# Patient Record
Sex: Female | Born: 1960 | Race: White | Hispanic: No | Marital: Married | Smoking: Former smoker
Health system: Southern US, Community
[De-identification: ages and names within clinical notes are randomized; demographics above are authoritative.]

## PROBLEM LIST (undated history)

## (undated) DIAGNOSIS — J449 Chronic obstructive pulmonary disease, unspecified: Secondary | ICD-10-CM

## (undated) DIAGNOSIS — J189 Pneumonia, unspecified organism: Secondary | ICD-10-CM

## (undated) DIAGNOSIS — C539 Malignant neoplasm of cervix uteri, unspecified: Secondary | ICD-10-CM

## (undated) HISTORY — PX: NO PAST SURGERIES: SHX2092

---

## 2016-02-29 ENCOUNTER — Telehealth: Payer: Self-pay

## 2016-02-29 NOTE — Telephone Encounter (Signed)
lmov to schedule consult appt per referral.  02/29/16 [facility]

## 2016-03-02 ENCOUNTER — Inpatient Hospital Stay
Admission: EM | Admit: 2016-03-02 | Discharge: 2016-03-11 | DRG: 193 | Disposition: A | Discharge status: Home / Self Care | Payer: BC Managed Care – PPO | Attending: Internal Medicine | Admitting: Internal Medicine

## 2016-03-02 ENCOUNTER — Encounter: Payer: Self-pay | Admitting: Medical Oncology

## 2016-03-02 ENCOUNTER — Emergency Department: Payer: BC Managed Care – PPO

## 2016-03-02 DIAGNOSIS — J9602 Acute respiratory failure with hypercapnia: Secondary | ICD-10-CM | POA: Diagnosis not present

## 2016-03-02 DIAGNOSIS — J111 Influenza due to unidentified influenza virus with other respiratory manifestations: Secondary | ICD-10-CM | POA: Diagnosis not present

## 2016-03-02 DIAGNOSIS — E871 Hypo-osmolality and hyponatremia: Secondary | ICD-10-CM | POA: Diagnosis present

## 2016-03-02 DIAGNOSIS — J9600 Acute respiratory failure, unspecified whether with hypoxia or hypercapnia: Secondary | ICD-10-CM

## 2016-03-02 DIAGNOSIS — F17210 Nicotine dependence, cigarettes, uncomplicated: Secondary | ICD-10-CM | POA: Diagnosis present

## 2016-03-02 DIAGNOSIS — J9601 Acute respiratory failure with hypoxia: Secondary | ICD-10-CM | POA: Diagnosis present

## 2016-03-02 DIAGNOSIS — J101 Influenza due to other identified influenza virus with other respiratory manifestations: Secondary | ICD-10-CM | POA: Diagnosis present

## 2016-03-02 DIAGNOSIS — J441 Chronic obstructive pulmonary disease with (acute) exacerbation: Secondary | ICD-10-CM | POA: Diagnosis present

## 2016-03-02 DIAGNOSIS — J9690 Respiratory failure, unspecified, unspecified whether with hypoxia or hypercapnia: Secondary | ICD-10-CM | POA: Diagnosis present

## 2016-03-02 DIAGNOSIS — R531 Weakness: Secondary | ICD-10-CM

## 2016-03-02 DIAGNOSIS — T380X5A Adverse effect of glucocorticoids and synthetic analogues, initial encounter: Secondary | ICD-10-CM | POA: Diagnosis present

## 2016-03-02 DIAGNOSIS — Z8541 Personal history of malignant neoplasm of cervix uteri: Secondary | ICD-10-CM

## 2016-03-02 DIAGNOSIS — R262 Difficulty in walking, not elsewhere classified: Secondary | ICD-10-CM

## 2016-03-02 DIAGNOSIS — J432 Centrilobular emphysema: Secondary | ICD-10-CM | POA: Diagnosis not present

## 2016-03-02 DIAGNOSIS — R0603 Acute respiratory distress: Secondary | ICD-10-CM | POA: Diagnosis present

## 2016-03-02 HISTORY — DX: Pneumonia, unspecified organism: J18.9

## 2016-03-02 HISTORY — DX: Malignant neoplasm of cervix uteri, unspecified: C53.9

## 2016-03-02 HISTORY — DX: Chronic obstructive pulmonary disease, unspecified: J44.9

## 2016-03-02 LAB — BLOOD GAS, ARTERIAL
ACID-BASE EXCESS: 5 mmol/L — AB (ref 0.0–2.0)
BICARBONATE: 31 mmol/L — AB (ref 20.0–28.0)
Delivery systems: POSITIVE
EXPIRATORY PAP: 6
FIO2: 0.8
Inspiratory PAP: 10
Mechanical Rate: 8
O2 SAT: 97.8 %
PATIENT TEMPERATURE: 37
PO2 ART: 101 mmHg (ref 83.0–108.0)
pCO2 arterial: 50 mmHg — ABNORMAL HIGH (ref 32.0–48.0)
pH, Arterial: 7.4 (ref 7.350–7.450)

## 2016-03-02 LAB — BLOOD GAS, VENOUS
Acid-Base Excess: 4.8 mmol/L — ABNORMAL HIGH (ref 0.0–2.0)
Bicarbonate: 31 mmol/L — ABNORMAL HIGH (ref 20.0–28.0)
FIO2: 0.8
O2 Saturation: 83.4 %
PCO2 VEN: 50 mmHg (ref 44.0–60.0)
PH VEN: 7.4 (ref 7.250–7.430)
Patient temperature: 37
pO2, Ven: 48 mmHg — ABNORMAL HIGH (ref 32.0–45.0)

## 2016-03-02 LAB — CBC
HCT: 43.3 % (ref 35.0–47.0)
Hemoglobin: 14.9 g/dL (ref 12.0–16.0)
MCH: 29 pg (ref 26.0–34.0)
MCHC: 34.3 g/dL (ref 32.0–36.0)
MCV: 84.7 fL (ref 80.0–100.0)
PLATELETS: 189 10*3/uL (ref 150–440)
RBC: 5.12 MIL/uL (ref 3.80–5.20)
RDW: 14.2 % (ref 11.5–14.5)
WBC: 9.4 10*3/uL (ref 3.6–11.0)

## 2016-03-02 LAB — TROPONIN I: Troponin I: <0.03 ng/mL (ref <0.03)

## 2016-03-02 LAB — BASIC METABOLIC PANEL
ANION GAP: 10 (ref 5–15)
BUN: 18 mg/dL (ref 6–20)
CALCIUM: 8.5 mg/dL — AB (ref 8.9–10.3)
CHLORIDE: 94 mmol/L — AB (ref 101–111)
CO2: 28 mmol/L (ref 22–32)
Creatinine, Ser: 0.68 mg/dL (ref 0.44–1.00)
GFR calc Af Amer: >60 mL/min (ref >60)
GFR calc non Af Amer: >60 mL/min (ref >60)
Glucose, Bld: 134 mg/dL — ABNORMAL HIGH (ref 65–99)
Potassium: 4.4 mmol/L (ref 3.5–5.1)
SODIUM: 132 mmol/L — AB (ref 135–145)

## 2016-03-02 LAB — INFLUENZA PANEL BY PCR (TYPE A & B)
INFLAPCR: POSITIVE — AB
INFLBPCR: NEGATIVE

## 2016-03-02 LAB — LACTIC ACID, PLASMA: LACTIC ACID, VENOUS: 1.6 mmol/L (ref 0.5–1.9)

## 2016-03-02 MED ORDER — IPRATROPIUM-ALBUTEROL 0.5-2.5 (3) MG/3ML IN SOLN
3.0000 mL | Freq: Once | RESPIRATORY_TRACT | Status: AC
  Administered 2016-03-02: 3 mL via RESPIRATORY_TRACT
  Filled 2016-03-02: qty 3

## 2016-03-02 MED ORDER — IPRATROPIUM-ALBUTEROL 0.5-2.5 (3) MG/3ML IN SOLN: 3.0000 mL | RESPIRATORY_TRACT | Status: DC | PRN

## 2016-03-02 MED ORDER — IBUPROFEN 400 MG PO TABS: 600.0000 mg | ORAL_TABLET | Freq: Four times a day (QID) | ORAL | Status: DC | PRN

## 2016-03-02 MED ORDER — LORAZEPAM 2 MG/ML IJ SOLN
INTRAMUSCULAR | Status: AC
  Filled 2016-03-02: qty 1

## 2016-03-02 MED ORDER — OSELTAMIVIR PHOSPHATE 75 MG PO CAPS
75.0000 mg | ORAL_CAPSULE | Freq: Once | ORAL | Status: AC
  Administered 2016-03-02: 75 mg via ORAL
  Filled 2016-03-02: qty 1

## 2016-03-02 MED ORDER — ACETAMINOPHEN 325 MG PO TABS
650.0000 mg | ORAL_TABLET | ORAL | Status: DC | PRN
  Administered 2016-03-03: 650 mg via ORAL
  Filled 2016-03-02: qty 2

## 2016-03-02 MED ORDER — HEPARIN SODIUM (PORCINE) 5000 UNIT/ML IJ SOLN
5000.0000 [IU] | Freq: Three times a day (TID) | INTRAMUSCULAR | Status: DC
  Administered 2016-03-03: 5000 [IU] via SUBCUTANEOUS
  Filled 2016-03-02: qty 1

## 2016-03-02 MED ORDER — METHYLPREDNISOLONE SODIUM SUCC 40 MG IJ SOLR
40.0000 mg | Freq: Four times a day (QID) | INTRAMUSCULAR | Status: DC
  Administered 2016-03-02 – 2016-03-03 (×3): 40 mg via INTRAVENOUS
  Filled 2016-03-02 (×3): qty 1

## 2016-03-02 MED ORDER — LORAZEPAM 2 MG/ML IJ SOLN
0.5000 mg | Freq: Once | INTRAMUSCULAR | Status: AC
  Administered 2016-03-02: 0.5 mg via INTRAVENOUS

## 2016-03-02 MED ORDER — OSELTAMIVIR PHOSPHATE 30 MG PO CAPS
30.0000 mg | ORAL_CAPSULE | Freq: Two times a day (BID) | ORAL | Status: AC
  Administered 2016-03-03 – 2016-03-07 (×9): 30 mg via ORAL
  Filled 2016-03-02 (×11): qty 1

## 2016-03-02 MED ORDER — OSELTAMIVIR PHOSPHATE 75 MG PO CAPS: 75.0000 mg | ORAL_CAPSULE | Freq: Two times a day (BID) | ORAL | Status: DC

## 2016-03-02 MED ORDER — NICOTINE 7 MG/24HR TD PT24
7.0000 mg | MEDICATED_PATCH | Freq: Every day | TRANSDERMAL | Status: DC
  Filled 2016-03-02 (×5): qty 1

## 2016-03-02 NOTE — ED Triage Notes (Signed)
Pt from home via ems with reports that she was diagnosed with pneumonia Friday, since then she has been taking levaquin and prednisone she began to feel better then yesterday began having worsening sob. Pt reports back pain and non productive cough. When ems arrived pts O2 sats were 72% on RA. Pt was placed on 4L [place], given 1 albuterol, 2 duonebs and 125mg  solumedrol pta.

## 2016-03-02 NOTE — ED Notes (Signed)
Pt anxious d/t bipap. MD made aware.

## 2016-03-02 NOTE — H&P (Signed)
PULMONARY / CRITICAL CARE MEDICINE   Name: Cristina Hale MRN: [id number] DOB: 1960-10-09    ADMISSION DATE:  03/02/2016 CONSULTATION DATE: 03/03/15  REFERRING MD:  Dr. Rudene Re  CHIEF COMPLAINT:  Acute Respiratory distress  HISTORY OF PRESENT ILLNESS:   Cristina Hale is 56 yo female with PMH significant for tobacco abuse and COPD.  Patient states that she was diagnosed with right sided pneumonia 5 days ago at urgent care and was placed on levaquin.  Patient's condition has progressively worsened since then with progressive shortness of breath and poor oral intake.  Patient denies any fever, nausea,vomitting or chest pain at this time.  Patient was experiencing increased shortness of breath therefore was placed on Bipap. She was noted to be positive for flu. Therefore PCCM team was called to admit the patient.  PAST MEDICAL HISTORY :  She  has a past medical history of Cervical cancer ([place]).  PAST SURGICAL HISTORY: She  has no past surgical history on file.  No Known Allergies  No current facility-administered medications on file prior to encounter.    No current outpatient prescriptions on file prior to encounter.    FAMILY HISTORY:  Her has no family status information on file.    SOCIAL HISTORY: She  reports that she has been smoking.  She has been smoking about 0.50 packs per day. She has never used smokeless tobacco. She reports that she does not drink alcohol or use drugs.  REVIEW OF SYSTEMS:   Review of Systems  Constitutional: Positive for malaise/fatigue. Negative for chills and weight loss.  HENT: Negative for congestion, nosebleeds and sinus pain.   Eyes: Negative for photophobia, pain and discharge.  Respiratory: Positive for shortness of breath and wheezing. Negative for cough, hemoptysis and stridor.   Cardiovascular: Negative for orthopnea and claudication.  Gastrointestinal: Negative for abdominal pain, blood in stool, constipation and diarrhea.   Genitourinary: Negative for frequency and hematuria.  Musculoskeletal: Negative for back pain and joint pain.  Neurological: Positive for weakness. Negative for tremors and sensory change.  Endo/Heme/Allergies: Negative for environmental allergies and polydipsia.  Psychiatric/Behavioral: The patient is not nervous/anxious and does not have insomnia.      SUBJECTIVE:  Patient states that she has been having increased shortness of breath.  VITAL SIGNS: BP (!) 135/91   Pulse (!) 125   Temp 97.9 F (36.6 C) (Oral)   Resp (!) 24   Ht 5\' 2"  (1.575 m)   Wt 44 kg (97 lb)   SpO2 98%   BMI 17.74 kg/m   HEMODYNAMICS:    VENTILATOR SETTINGS: FiO2 (%):  [80 %] 80 %  INTAKE / OUTPUT: No intake/output data recorded.  PHYSICAL EXAMINATION: General:  Middle aged female, on Bipap , in moderate distress Neuro:  Neuro intact HEENT:  AT,[place], no jvd, PERRLA Cardiovascular: Tachycardic, regular, no MRG noted Lungs:  Faint expiratory wheezes, no crackles, rhonchi noted Abdomen:  Soft, non tender, + BS Musculoskeletal:  No inflammation/deformity, ROM intact Skin: warm, intact and dry.  LABS:  BMET  Recent Labs Lab 03/02/16 1727  NA 132*  K 4.4  CL 94*  CO2 28  BUN 18  CREATININE 0.68  GLUCOSE 134*    Electrolytes  Recent Labs Lab 03/02/16 1727  CALCIUM 8.5*    CBC  Recent Labs Lab 03/02/16 1727  WBC 9.4  HGB 14.9  HCT 43.3  PLT 189    Coag's No results for input(s): APTT, INR in the last 168 hours.  Sepsis  Markers  Recent Labs Lab 03/02/16 1727  LATICACIDVEN 1.6    ABG No results for input(s): PHART, PCO2ART, PO2ART in the last 168 hours.  Liver Enzymes No results for input(s): AST, ALT, ALKPHOS, BILITOT, ALBUMIN in the last 168 hours.  Cardiac Enzymes  Recent Labs Lab 03/02/16 1727  TROPONINI <0.03    Glucose No results for input(s): GLUCAP in the last 168 hours.  Imaging Dg Chest Portable 1 View  Result Date: 03/02/2016 CLINICAL  DATA:  Difficulty breathing.  Nonproductive cough today. EXAM: PORTABLE CHEST 1 VIEW COMPARISON:  None. FINDINGS: Heart size is normal. There is aortic atherosclerosis. The lungs are hyperinflated but clear. No infiltrate, collapse or effusion. No acute bone finding. IMPRESSION: No active disease.  Probable emphysema.  Aortic atherosclerosis. Electronically Signed   By: Nelson Chimes M.D.   On: 03/02/2016 18:13     STUDIES:  None   CULTURES: none  ANTIBIOTICS: None   SIGNIFICANT EVENTS: 1/31>> influenza A positive  LINES/TUBES: none   ASSESSMENT / PLAN:  PULMONARY A: Acute Respiratory Failure Influenza A  COPD  Tobacco abuse P:   Continue Bipap, wean as tolerated Steroids Bronchodilators Tamiflu Routine ABG Patient is at high risk for intubation Nicotine patch Smoking Cessation counseling  CARDIOVASCULAR A:  No active issues P:  Continuous Telemetry Keep MAP goals >65  RENAL A:   Mild hyponatremia P:   Replace electrolytes per usual guidelines Follow chemistry intermittenly  GASTROINTESTINAL A:   No active issues P:   NPO for now   HEMATOLOGIC A:   No active issues P:  Heparin for DVT prophylaxis Transfuse per usual guidelines  INFECTIOUS A:   Influenza A pos. P:   Monitor fever curve  Continue Tamiflu   ENDOCRINE A:   No active issues  P:   BG checks intermittently with BMP  NEUROLOGIC A:   No active issues P:   Provide supportive care   FAMILY  - Updates: Family was updated regarding the patient's status and plan of care.  Bincy Varughese,AG-ACNP Pulmonary and [place]   03/02/2016, 8:10 PM   Pt seen on morning following admission. She is in no distress and cognition is intact. Still on HFNC @ 45%. BS are markedly diminished but there is no wheezing. CXR is consistent with advanced emphysema without edema or infiltrates. I have counseled re smoking cessation. Treating as COPD  exac  Merton Border, MD PCCM service Mobile [phone] Pager [phone] 03/03/2016

## 2016-03-02 NOTE — ED Notes (Signed)
Called respiratory to draw ABG, no response at this time. Will continue to call.

## 2016-03-02 NOTE — ED Provider Notes (Signed)
[facility] Emergency Department Provider Note  ____________________________________________  Time seen: Approximately 5:50 PM  I have reviewed the triage vital signs and the nursing notes.   HISTORY  Chief Complaint Respiratory Distress and Cough   HPI Cristina Hale is a 56 y.o. female h/o smoking who presents for respiratory distress. Patient reports that she was diagnosed with right-sided pneumonia 5 days ago at urgent care and was placed on Levaquin. She has taken 5 days continues to have progressively worsening shortness of breath and severe today would prompt a visit to the emergency room. She has been coughing clear sputum. Reports fever on the first day of illness but no longer having fever. Also reports that she had upper sharp pleuritic back pain however that has resolved. Patient has a long history of smoking. Has not seen a physician in many decades. Denies personal or family history of ischemic heart disease.She denies chest pain, nausea, vomiting, diarrhea, abdominal pain, leg pain or swelling, personal or family history of blood clots, recent travel or immobilization, hemoptysis, exogenous hormones.  Past Medical History:  Diagnosis Date  . Cervical cancer ([place])     There are no active problems to display for this patient.   History reviewed. No pertinent surgical history.  Prior to Admission medications   Medication Sig Start Date End Date Taking? Authorizing Provider  albuterol (PROVENTIL HFA;VENTOLIN HFA) 108 (90 Base) MCG/ACT inhaler Inhale 1-2 puffs into the lungs every 4 (four) hours as needed for wheezing or shortness of breath.   Yes Historical Provider, MD  levofloxacin (LEVAQUIN) 500 MG tablet Take 500 mg by mouth daily.   Yes Historical Provider, MD  predniSONE (STERAPRED UNI-PAK 21 TAB) 10 MG (21) TBPK tablet Take 10 mg by mouth daily. Tapered dose   Yes Historical Provider, MD    Allergies Patient has no known  allergies.  No family history on file.  Social History Social History  Substance Use Topics  . Smoking status: Current Every Day Smoker    Packs/day: 0.50  . Smokeless tobacco: Never Used  . Alcohol use No    Review of Systems  Constitutional: Negative for fever. Eyes: Negative for visual changes. ENT: Negative for sore throat. Neck: No neck pain  Cardiovascular: Negative for chest pain. Respiratory: + shortness of breath and cough Gastrointestinal: Negative for abdominal pain, vomiting or diarrhea. Genitourinary: Negative for dysuria. Musculoskeletal: Negative for back pain. Skin: Negative for rash. Neurological: Negative for headaches, weakness or numbness. Psych: No SI or HI  ____________________________________________   PHYSICAL EXAM:  VITAL SIGNS: ED Triage Vitals  Enc Vitals Group     BP 03/02/16 1721 130/74     Pulse Rate 03/02/16 1721 99     Resp 03/02/16 1721 (!) 24     Temp 03/02/16 1721 97.9 F (36.6 C)     Temp Source 03/02/16 1721 Oral     SpO2 03/02/16 1714 (!) 72 %     Weight 03/02/16 1722 97 lb (44 kg)     Height 03/02/16 1722 5\' 2"  (1.575 m)     Head Circumference --      Peak Flow --      Pain Score --      Pain Loc --      Pain Edu? --      Excl. in [place]? --     Constitutional: Alert and oriented in severe respiratory distress.  HEENT:      Head: Normocephalic and atraumatic.  Eyes: Conjunctivae are normal. Sclera is non-icteric. EOMI. PERRL      Mouth/Throat: Mucous membranes are moist.       Neck: Supple with no signs of meningismus. Cardiovascular: Regular rate and rhythm. No murmurs, gallops, or rubs. 2+ symmetrical distal pulses are present in all extremities. No JVD. Respiratory: Tachypnea ache, hypoxic to the low 70s on room air, improved to low 90s on 15L NRB, severely diminished air movement. No crackles or wheezes. Gastrointestinal: Soft, non tender, and non distended with positive bowel sounds. No rebound or  guarding. Musculoskeletal: Nontender with normal range of motion in all extremities. No edema, cyanosis, or erythema of extremities. Neurologic: Normal speech and language. Face is symmetric. Moving all extremities. No gross focal neurologic deficits are appreciated. Skin: Skin is warm, dry and intact. No rash noted. Psychiatric: Mood and affect are normal. Speech and behavior are normal.  ____________________________________________   LABS (all labs ordered are listed, but only abnormal results are displayed)  Labs Reviewed  BASIC METABOLIC PANEL - Abnormal; Notable for the following:       Result Value   Sodium 132 (*)    Chloride 94 (*)    Glucose, Bld 134 (*)    Calcium 8.5 (*)    All other components within normal limits  BLOOD GAS, VENOUS - Abnormal; Notable for the following:    pO2, Ven 48.0 (*)    Bicarbonate 31.0 (*)    Acid-Base Excess 4.8 (*)    All other components within normal limits  INFLUENZA PANEL BY PCR (TYPE A & B) - Abnormal; Notable for the following:    Influenza A By PCR POSITIVE (*)    All other components within normal limits  CBC  TROPONIN I  LACTIC ACID, PLASMA   ____________________________________________  EKG  ED ECG REPORT I, Rudene Re, the attending physician, personally viewed and interpreted this ECG.   Sinus tachycardia, rate of 100, normal intervals, normal axis, no ST elevations or depressions.  ____________________________________________  RADIOLOGY  CXR: Negative  ____________________________________________   PROCEDURES  Procedure(s) performed: None Procedures Critical Care performed: yes  CRITICAL CARE Performed by: Rudene Re  ?  Total critical care time: 35 min  Critical care time was exclusive of separately billable procedures and treating other patients.  Critical care was necessary to treat or prevent imminent or life-threatening deterioration.  Critical care was time spent personally by me  on the following activities: development of treatment plan with patient and/or surrogate as well as nursing, discussions with consultants, evaluation of patient's response to treatment, examination of patient, obtaining history from patient or surrogate, ordering and performing treatments and interventions, ordering and review of laboratory studies, ordering and review of radiographic studies, pulse oximetry and re-evaluation of patient's condition.  ____________________________________________   INITIAL IMPRESSION / ASSESSMENT AND PLAN / ED COURSE   56 y.o. female h/o smoking who presents for respiratory distress in the setting of diagnosis of pneumonia 5 days ago. Patient is currently on Levaquin. Patient arrives in severe respiratory distress, hypoxic to the low 70s on room air, she was put on a nonrebreather at 15 L with improvement to the low 90s. Patient was started on BiPAP. Patient is severely diminished air movement bilaterally and a history of smoking. Patient was started on DuoNeb. She received 3 breathing EMS and 125 mg of Solu-Medrol. CXR done showing hyperinflation with no infiltrate. EKG, blood work, flu swab pending  Clinical Course as of Mar 03 1847  Wed Mar 02, 2016  1847 Patient positive for influenza A. VBG showing normal PCO2 and pH. Patient continues to be on BiPAP at 80% however feels improved at this time and is satting 100%. Patient be admitted to the ICU. Patient was started on Tamiflu.  [CV]    Clinical Course User Index [CV] Rudene Re, MD    Pertinent labs & imaging results that were available during my care of the patient were reviewed by me and considered in my medical decision making (see chart for details).    ____________________________________________   FINAL CLINICAL IMPRESSION(S) / ED DIAGNOSES  Final diagnoses:  Acute respiratory failure with hypoxia (HCC)  Influenza A      NEW MEDICATIONS STARTED DURING THIS VISIT:  New Prescriptions    No medications on file     Note:  This document was prepared using Dragon voice recognition software and may include unintentional dictation errors.    Rudene Re, MD 03/02/16 [phone]

## 2016-03-02 NOTE — Progress Notes (Signed)
Tamiflu dose adjustment:   Changed dose from oseltamivir 75 mg PO BID to 30 mg PO BID for renal function. Per protocol.  Lenis Noon, PharmD 03/02/16 7:58 PM

## 2016-03-02 NOTE — ED Notes (Signed)
Hospitalist at bedside 

## 2016-03-02 NOTE — Progress Notes (Signed)
eLink Physician-Brief Progress Note Patient Name: Cristina Hale DOB: Nov 14, 1960 MRN: [id number]   Date of Service  03/02/2016  HPI/Events of Note  56 yo with acute resp failure fio2 80% On biPAP, +FLU A  eICU Interventions  Admit to ICU IV steroids Aggressive BD therapy High risk for intubation     Intervention Category Evaluation Type: New Patient Evaluation  Comfort Iversen 03/02/2016, 7:25 PM

## 2016-03-03 ENCOUNTER — Encounter: Payer: Self-pay | Admitting: *Deleted

## 2016-03-03 DIAGNOSIS — J9601 Acute respiratory failure with hypoxia: Secondary | ICD-10-CM

## 2016-03-03 LAB — CBC
HCT: 37 % (ref 35.0–47.0)
Hemoglobin: 12.3 g/dL (ref 12.0–16.0)
MCH: 28.5 pg (ref 26.0–34.0)
MCHC: 33.3 g/dL (ref 32.0–36.0)
MCV: 85.7 fL (ref 80.0–100.0)
Platelets: 172 10*3/uL (ref 150–440)
RBC: 4.32 MIL/uL (ref 3.80–5.20)
RDW: 14.2 % (ref 11.5–14.5)
WBC: 4.1 10*3/uL (ref 3.6–11.0)

## 2016-03-03 LAB — BASIC METABOLIC PANEL
Anion gap: 6 (ref 5–15)
BUN: 17 mg/dL (ref 6–20)
CO2: 31 mmol/L (ref 22–32)
CREATININE: 0.49 mg/dL (ref 0.44–1.00)
Calcium: 8.4 mg/dL — ABNORMAL LOW (ref 8.9–10.3)
Chloride: 98 mmol/L — ABNORMAL LOW (ref 101–111)
Glucose, Bld: 148 mg/dL — ABNORMAL HIGH (ref 65–99)
Potassium: 4.4 mmol/L (ref 3.5–5.1)
SODIUM: 135 mmol/L (ref 135–145)

## 2016-03-03 LAB — GLUCOSE, CAPILLARY
Glucose-Capillary: 147 mg/dL — ABNORMAL HIGH (ref 65–99)
Glucose-Capillary: 193 mg/dL — ABNORMAL HIGH (ref 65–99)
Glucose-Capillary: 76 mg/dL (ref 65–99)

## 2016-03-03 LAB — MRSA PCR SCREENING: MRSA by PCR: NEGATIVE

## 2016-03-03 LAB — PHOSPHORUS: PHOSPHORUS: 4 mg/dL (ref 2.5–4.6)

## 2016-03-03 LAB — MAGNESIUM: MAGNESIUM: 2.3 mg/dL (ref 1.7–2.4)

## 2016-03-03 MED ORDER — BOOST / RESOURCE BREEZE PO LIQD
1.0000 | Freq: Three times a day (TID) | ORAL | Status: DC
  Administered 2016-03-03 – 2016-03-06 (×3): 1 via ORAL

## 2016-03-03 MED ORDER — METHYLPREDNISOLONE SODIUM SUCC 40 MG IJ SOLR
40.0000 mg | Freq: Two times a day (BID) | INTRAMUSCULAR | Status: DC
  Administered 2016-03-03 – 2016-03-04 (×3): 40 mg via INTRAVENOUS
  Filled 2016-03-03 (×3): qty 1

## 2016-03-03 MED ORDER — UMECLIDINIUM-VILANTEROL 62.5-25 MCG/INH IN AEPB
1.0000 | INHALATION_SPRAY | Freq: Every day | RESPIRATORY_TRACT | Status: DC
  Administered 2016-03-03 – 2016-03-05 (×3): 1 via RESPIRATORY_TRACT
  Filled 2016-03-03: qty 14

## 2016-03-03 MED ORDER — ENOXAPARIN SODIUM 30 MG/0.3ML ~~LOC~~ SOLN
30.0000 mg | SUBCUTANEOUS | Status: DC
  Administered 2016-03-03 – 2016-03-10 (×6): 30 mg via SUBCUTANEOUS
  Filled 2016-03-03 (×6): qty 0.3

## 2016-03-03 NOTE — Progress Notes (Signed)
Initial Nutrition Assessment  DOCUMENTATION CODES:   Underweight  INTERVENTION:  -Recommend liberalizing diet to Regular -Recommend addition of Boost Breeze po TID, each supplement provides 250 kcal and 9 grams of protein (pt requested juice-like supplement)  NUTRITION DIAGNOSIS:   Inadequate oral intake related to acute illness as evidenced by per patient/family report.  GOAL:   Patient will meet greater than or equal to 90% of their needs  MONITOR:   PO intake, Supplement acceptance, Labs, Weight trends  REASON FOR ASSESSMENT:   Malnutrition Screening Tool    ASSESSMENT:    56 yo female admitted with acute respiratory distress with COPD, influenza A +  Pt currently on HFNC; did not eat much breakfast this AM. Reports appetite acutely has been very poor, eating minimally over the past week. Pt reports however that her appetite has always been small. Typically eats Nekot crackers for breakfast, small lunch and good dinner. Pt reports gradual wt loss over the years; UBW 125 pounds but reports no significant wt loss over the past year  Nutrition-Focused physical exam completed. Findings are no fat depletion, no muscle depletion, and no edema.   Labs: reviewed Meds: solumedrol  Diet Order:  Heart Healthy  Skin:  Reviewed, no issues  Last BM:  no documented BM  Height:   Ht Readings from Last 1 Encounters:  03/02/16 5\' 2"  (1.575 m)    Weight:   Wt Readings from Last 1 Encounters:  03/03/16 97 lb 7.1 oz (44.2 kg)    BMI:  Body mass index is 17.82 kg/m.  Estimated Nutritional Needs:   Kcal:  1400-1800 kcals  Protein:  70-85 g  Fluid:  >/= 1.4 L  EDUCATION NEEDS:   No education needs identified at this time  [place], [place], [place] [phone] Pager  [phone] Weekend/On-Call Pager

## 2016-03-03 NOTE — Progress Notes (Signed)
PULMONARY / CRITICAL CARE MEDICINE   Name: Cristina Hale MRN: [id number] DOB: 1960/11/04    ADMISSION DATE:  03/02/2016 CONSULTATION DATE: 03/03/15  REFERRING MD:  Dr. Rudene Re  CHIEF COMPLAINT:  Acute Respiratory distress  HISTORY OF PRESENT ILLNESS:   Cristina Hale is 56 yo female with PMH significant for tobacco abuse and COPD.  Patient states that she was diagnosed with right sided pneumonia 5 days ago at urgent care and was placed on levaquin.  Patient's condition has progressively worsened since then with progressive shortness of breath and poor oral intake.  Patient denies any fever, nausea,vomitting or chest pain at this time.  Patient was experiencing increased shortness of breath therefore was placed on Bipap. She was noted to be positive for flu. Therefore PCCM team was called to admit the patient.  PAST MEDICAL HISTORY :  She  has a past medical history of Cervical cancer ([place]).  PAST SURGICAL HISTORY: She  has no past surgical history on file.  No Known Allergies  No current facility-administered medications on file prior to encounter.    No current outpatient prescriptions on file prior to encounter.    FAMILY HISTORY:  Her has no family status information on file.    SOCIAL HISTORY: She  reports that she has been smoking.  She has been smoking about 0.50 packs per day. She has never used smokeless tobacco. She reports that she does not drink alcohol or use drugs.  REVIEW OF SYSTEMS: Positives in BOLD Gen:  fever, chills, weight change, fatigue, night sweats HEENT:  blurred vision, double vision, hearing loss, tinnitus, sinus congestion, rhinorrhea, sore throat, neck stiffness, dysphagia PULM: shortness of breath, cough, sputum production, hemoptysis, wheezing CV: chest pain, edema, orthopnea, paroxysmal nocturnal dyspnea, palpitations GI: abdominal pain, nausea, vomiting, diarrhea, hematochezia, melena, constipation, change in bowel habits GU: dysuria,  hematuria, polyuria, oliguria, urethral discharge Endocrine: hot or cold intolerance, polyuria, polyphagia or appetite change Derm:  rash, dry skin, scaling or peeling skin change Heme: easy bruising, bleeding, bleeding gums Neuro: headache, numbness, weakness, slurred speech, loss of memory or consciousness  SUBJECTIVE:  Patient states that her shortness of breath has improved, feeling better  VITAL SIGNS: BP 119/77   Pulse 75   Temp 98.5 F (36.9 C)   Resp 14   Ht 5\' 2"  (1.575 m)   Wt 44.2 kg (97 lb 7.1 oz)   SpO2 93%   BMI 17.82 kg/m   HEMODYNAMICS:  VENTILATOR SETTINGS: FiO2 (%):  [50 %-80 %] 50 %  INTAKE / OUTPUT: I/O last 3 completed shifts: In: -  Out: 350 [Urine:350]  PHYSICAL EXAMINATION: General:  Middle aged caucasion female, on HFNC,sitting in bed in no acute distress Neuro:  Neuro intact, Alert and oriented, follows commands HEENT: Atraumatic, Normocephalic, No JVD, PERRLA Cardiovascular: RRR, s1s2, no M/R/G noted Lungs: Clear throughout, symmetrical expansion, no accessory muscle use Abdomen:  Soft, non tender, + BS x4 Musculoskeletal: Normal bulk and tone, ROM intact Skin: warm, intact and dry. No rashes, lesions, or ulcerations noted  LABS:  BMET  Last Labs    Recent Labs Lab 03/02/16 1727 03/03/16 0558  NA 132* 135  K 4.4 4.4  CL 94* 98*  CO2 28 31  BUN 18 17  CREATININE 0.68 0.49  GLUCOSE 134* 148*      Electrolytes  Last Labs    Recent Labs Lab 03/02/16 1727 03/03/16 0558  CALCIUM 8.5* 8.4*  MG  --  2.3  PHOS  --  4.0      CBC  Last Labs    Recent Labs Lab 03/02/16 1727 03/03/16 0558  WBC 9.4 4.1  HGB 14.9 12.3  HCT 43.3 37.0  PLT 189 172      Coag's Last Labs   No results for input(s): APTT, INR in the last 168 hours.    Sepsis Markers  Last Labs    Recent Labs Lab 03/02/16 1727  LATICACIDVEN 1.6      ABG  Last Labs    Recent Labs Lab 03/02/16 2205  PHART 7.40   PCO2ART 50*  PO2ART 101      Liver Enzymes Last Labs   No results for input(s): AST, ALT, ALKPHOS, BILITOT, ALBUMIN in the last 168 hours.    Cardiac Enzymes  Last Labs    Recent Labs Lab 03/02/16 1727  TROPONINI <0.03      Glucose  Last Labs    Recent Labs Lab 03/02/16 2341 03/03/16 0445  GLUCAP 193* 76      Imaging Dg Chest Portable 1 View  Result Date: 03/02/2016 CLINICAL DATA:  Difficulty breathing.  Nonproductive cough today. EXAM: PORTABLE CHEST 1 VIEW COMPARISON:  None. FINDINGS: Heart size is normal. There is aortic atherosclerosis. The lungs are hyperinflated but clear. No infiltrate, collapse or effusion. No acute bone finding. IMPRESSION: No active disease.  Probable emphysema.  Aortic atherosclerosis. Electronically Signed   By: Nelson Chimes M.D.   On: 03/02/2016 18:13     STUDIES:  03/02/16 CXR>> No active disease.  Probable emphysema.  Aortic atherosclerosis.  CULTURES: 03/02/16 MRSA PCR>> Negative  ANTIBIOTICS: None   SIGNIFICANT EVENTS: 1/31>> influenza A positive  LINES/TUBES: none   ASSESSMENT / PLAN:  PULMONARY A: Acute Hypercapnic Respiratory Failure secondary to Influenza A and AECOPD  Tobacco abuse P:   Continue HFNC, wean as tolerated Bipap PRN Continue Steroids Continue Bronchodilators Tamiflu Routine ABG Nicotine patch Smoking Cessation counseling  CARDIOVASCULAR A:  No active issues P:  Continuous Telemetry  MAP Goal >65  RENAL A:   Mild hyponatremia>> Resolved P:   Replace electrolytes per usual guidelines Follow chemistry intermittenly  GASTROINTESTINAL A:   Intermittent Nausea P:   Prn Zofran Regular diet  HEMATOLOGIC A:   No active issues P:  Heparin for DVT prophylaxis Transfuse for Hgb < 7.0  INFECTIOUS A:   Influenza A pos. P:   Monitor fever curve  Continue Tamiflu   ENDOCRINE A:   No active issues  P:   BG checks intermittently with BMP Follow  Hyper/Hypoglycemia protocol  NEUROLOGIC A:   No active issues P:   Provide supportive care   FAMILY  - Updates: Family was updated regarding the patient's status and plan of care.    Merton Border, MD PCCM service Mobile [phone] Pager [phone] 03/03/2016     03/03/2016, 9:58 AM

## 2016-03-03 NOTE — Progress Notes (Signed)
A&Ox4.  Dyspnic with exertion.  o2 sats 92% at this time on 50% fio2 via HF nasal cannula.  NSR per cardiac monitor.  Tolerating diet.  Family visited throughout shift.

## 2016-03-04 DIAGNOSIS — J9601 Acute respiratory failure with hypoxia: Secondary | ICD-10-CM

## 2016-03-04 DIAGNOSIS — J432 Centrilobular emphysema: Secondary | ICD-10-CM

## 2016-03-04 DIAGNOSIS — J101 Influenza due to other identified influenza virus with other respiratory manifestations: Principal | ICD-10-CM

## 2016-03-04 NOTE — Progress Notes (Signed)
PULMONARY / CRITICAL CARE MEDICINE   Name: Cristina Hale MRN: [id number] DOB: 05-Feb-1960    ADMISSION DATE:  03/02/2016 CONSULTATION DATE: 03/03/15  REFERRING MD:  Dr. Rudene Re  CHIEF COMPLAINT:  Acute Respiratory distress  HISTORY OF PRESENT ILLNESS:   Liridona Bultemeier is 56 yo female with PMH significant for tobacco abuse and COPD.  Patient states that she was diagnosed with right sided pneumonia 5 days ago at urgent care and was placed on levaquin.  Patient's condition has progressively worsened since then with progressive shortness of breath and poor oral intake.  Patient denies any fever, nausea,vomitting or chest pain at this time.  Patient was experiencing increased shortness of breath therefore was placed on Bipap. She was noted to be positive for flu. Therefore PCCM team was called to admit the patient.  PAST MEDICAL HISTORY :  She  has a past medical history of Cervical cancer ([place]).  PAST SURGICAL HISTORY: She  has no past surgical history on file.  No Known Allergies  No current facility-administered medications on file prior to encounter.    No current outpatient prescriptions on file prior to encounter.    FAMILY HISTORY:  Her has no family status information on file.    SOCIAL HISTORY: She  reports that she has been smoking.  She has been smoking about 0.50 packs per day. She has never used smokeless tobacco. She reports that she does not drink alcohol or use drugs.  REVIEW OF SYSTEMS: Positives in BOLD Gen:  fever, chills, weight change, fatigue, night sweats HEENT:  blurred vision, double vision, hearing loss, tinnitus, sinus congestion, rhinorrhea, sore throat, neck stiffness, dysphagia PULM: shortness of breath, cough, sputum production, hemoptysis, wheezing CV: chest pain, edema, orthopnea, paroxysmal nocturnal dyspnea, palpitations GI: abdominal pain, nausea, vomiting, diarrhea, hematochezia, melena, constipation, change in bowel habits GU: dysuria,  hematuria, polyuria, oliguria, urethral discharge Endocrine: hot or cold intolerance, polyuria, polyphagia or appetite change Derm:  rash, dry skin, scaling or peeling skin change Heme: easy bruising, bleeding, bleeding gums Neuro: headache, numbness, weakness, slurred speech, loss of memory or consciousness  SUBJECTIVE:  Patient states that her shortness of breath continues to improve; she does however state she is feeling a little short of breath following being up to chair and bathing  VITAL SIGNS: BP 119/77   Pulse 75   Temp 98.5 F (36.9 C)   Resp 14   Ht 5\' 2"  (1.575 m)   Wt 44.2 kg (97 lb 7.1 oz)   SpO2 93%   BMI 17.82 kg/m   HEMODYNAMICS:  VENTILATOR SETTINGS: FiO2 (%):  [50 %-80 %] 50 %  INTAKE / OUTPUT: I/O last 3 completed shifts: In: -  Out: 350 [Urine:350]  PHYSICAL EXAMINATION: General:  Middle aged caucasion female, on HFNC,sitting up in chair, in no acute distress Neuro:  Neuro intact, Alert and oriented, follows commands HEENT: Atraumatic, Normocephalic, No JVD, PERRLA Cardiovascular: RRR, s1s2, no M/R/G noted Lungs: Clear throughout, symmetrical expansion, no accessory muscle use Abdomen:  Soft, non tender, + BS x4 Musculoskeletal: Normal bulk and tone, ROM intact Skin: warm, intact and dry. No rashes, lesions, or ulcerations noted  LABS:  BMET  Last Labs    Recent Labs Lab 03/02/16 1727 03/03/16 0558  NA 132* 135  K 4.4 4.4  CL 94* 98*  CO2 28 31  BUN 18 17  CREATININE 0.68 0.49  GLUCOSE 134* 148*      Electrolytes  Last Labs    Recent Labs Lab  03/02/16 1727 03/03/16 0558  CALCIUM 8.5* 8.4*  MG  --  2.3  PHOS  --  4.0      CBC  Last Labs    Recent Labs Lab 03/02/16 1727 03/03/16 0558  WBC 9.4 4.1  HGB 14.9 12.3  HCT 43.3 37.0  PLT 189 172      Coag's Last Labs   No results for input(s): APTT, INR in the last 168 hours.    Sepsis Markers  Last Labs    Recent Labs Lab 03/02/16 1727   LATICACIDVEN 1.6      ABG  Last Labs    Recent Labs Lab 03/02/16 2205  PHART 7.40  PCO2ART 50*  PO2ART 101      Liver Enzymes Last Labs   No results for input(s): AST, ALT, ALKPHOS, BILITOT, ALBUMIN in the last 168 hours.    Cardiac Enzymes  Last Labs    Recent Labs Lab 03/02/16 1727  TROPONINI <0.03      Glucose  Last Labs    Recent Labs Lab 03/02/16 2341 03/03/16 0445  GLUCAP 193* 76      Imaging Dg Chest Portable 1 View  Result Date: 03/02/2016 CLINICAL DATA:  Difficulty breathing.  Nonproductive cough today. EXAM: PORTABLE CHEST 1 VIEW COMPARISON:  None. FINDINGS: Heart size is normal. There is aortic atherosclerosis. The lungs are hyperinflated but clear. No infiltrate, collapse or effusion. No acute bone finding. IMPRESSION: No active disease.  Probable emphysema.  Aortic atherosclerosis. Electronically Signed   By: Nelson Chimes M.D.   On: 03/02/2016 18:13     STUDIES:  03/02/16 CXR>> No active disease.  Probable emphysema.  Aortic atherosclerosis.  CULTURES: 03/02/16 MRSA PCR>> Negative  ANTIBIOTICS: None   SIGNIFICANT EVENTS: 1/31>> influenza A positive  LINES/TUBES: none   ASSESSMENT / PLAN:  PULMONARY A: Acute Hypercapnic Respiratory Failure secondary to Influenza A and AECOPD  Tobacco abuse P:   Continue HFNC, wean as tolerated, Attempt to wean to 6L [place] Continue Steroids Continue Bronchodilators ContinueTamiflu Nicotine patch Smoking Cessation counseling  CARDIOVASCULAR A:  No active issues P:  Continuous Telemetry  MAP Goal >65  RENAL A:   Mild hyponatremia>> Resolved P:   Replace electrolytes per protocol Follow chemistry intermittently BMP in AM 03/05/16  GASTROINTESTINAL A:   No acute issues P:   Regular diet  HEMATOLOGIC A:   No active issues P:  Lovenox for DVT prophylaxis Transfuse for Hgb < 7.0  INFECTIOUS A:   Influenza A pos. P:   Monitor fever  curve Continue Tamiflu   ENDOCRINE A:   No active issues  P:   BG checks intermittently with BMP Follow Hyper/Hypoglycemia protocol  NEUROLOGIC A:   No active issues P:   Provide supportive care   FAMILY  - Updates: Family was updated regarding the patient's status and plan of care.  If able to tolerate wean to 6L Nasal Cannula, consider transfer to Med/Surg unit later this afternoon.   Merton Border, MD PCCM service Mobile [phone] Pager [phone] 03/04/2016  03/03/2016, 9:58 AM

## 2016-03-04 NOTE — Care Management (Signed)
Patient admitted to icu stepdown due to need for continuous bipap.  she is being treated for pneumonia that failed outpatient treatment. She is currently on HFNC.  Prior to this episode of illness, Independent in all adls, denies issues accessing medical care, obtaining medications or with transportation.

## 2016-03-04 NOTE — Progress Notes (Signed)
A&Ox4. Sitting in bed watching tv and talking with family.  Up to recliner chair for several hours during shift and tolerated well although with getting up and bath she dropped o2 sats into mid 80's on high flow cannula.  Patient requested to try nasal cannula as discussed earlier in shift and now o2 sats 89% on 6 L nasal cannula with no respiratory distress at rest.

## 2016-03-05 ENCOUNTER — Inpatient Hospital Stay: Payer: BC Managed Care – PPO

## 2016-03-05 LAB — BASIC METABOLIC PANEL
ANION GAP: 7 (ref 5–15)
BUN: 26 mg/dL — ABNORMAL HIGH (ref 6–20)
CHLORIDE: 95 mmol/L — AB (ref 101–111)
CO2: 30 mmol/L (ref 22–32)
CREATININE: 0.47 mg/dL (ref 0.44–1.00)
Calcium: 8.3 mg/dL — ABNORMAL LOW (ref 8.9–10.3)
GFR calc non Af Amer: >60 mL/min (ref >60)
Glucose, Bld: 122 mg/dL — ABNORMAL HIGH (ref 65–99)
POTASSIUM: 4.5 mmol/L (ref 3.5–5.1)
SODIUM: 132 mmol/L — AB (ref 135–145)

## 2016-03-05 LAB — CBC
HEMATOCRIT: 40.1 % (ref 35.0–47.0)
HEMOGLOBIN: 13 g/dL (ref 12.0–16.0)
MCH: 27.6 pg (ref 26.0–34.0)
MCHC: 32.5 g/dL (ref 32.0–36.0)
MCV: 84.8 fL (ref 80.0–100.0)
Platelets: 295 10*3/uL (ref 150–440)
RBC: 4.72 MIL/uL (ref 3.80–5.20)
RDW: 13.9 % (ref 11.5–14.5)
WBC: 18.4 10*3/uL — ABNORMAL HIGH (ref 3.6–11.0)

## 2016-03-05 MED ORDER — BUDESONIDE 0.5 MG/2ML IN SUSP
0.5000 mg | Freq: Two times a day (BID) | RESPIRATORY_TRACT | Status: DC
  Administered 2016-03-05 – 2016-03-06 (×3): 0.5 mg via RESPIRATORY_TRACT
  Filled 2016-03-05 (×3): qty 2

## 2016-03-05 MED ORDER — METHYLPREDNISOLONE SODIUM SUCC 125 MG IJ SOLR
60.0000 mg | Freq: Once | INTRAMUSCULAR | Status: AC
  Administered 2016-03-05: 60 mg via INTRAVENOUS
  Filled 2016-03-05: qty 2

## 2016-03-05 MED ORDER — MORPHINE SULFATE (PF) 4 MG/ML IV SOLN
INTRAVENOUS | Status: AC
  Administered 2016-03-05: 2 mg via INTRAVENOUS
  Filled 2016-03-05: qty 1

## 2016-03-05 MED ORDER — METHYLPREDNISOLONE SODIUM SUCC 40 MG IJ SOLR
40.0000 mg | Freq: Two times a day (BID) | INTRAMUSCULAR | Status: DC
  Administered 2016-03-05 – 2016-03-06 (×2): 40 mg via INTRAVENOUS
  Filled 2016-03-05 (×2): qty 1

## 2016-03-05 MED ORDER — MORPHINE SULFATE (PF) 4 MG/ML IV SOLN
2.0000 mg | INTRAVENOUS | Status: DC | PRN
  Administered 2016-03-05: 2 mg via INTRAVENOUS

## 2016-03-05 MED ORDER — ONDANSETRON HCL 4 MG/2ML IJ SOLN: 4.0000 mg | Freq: Four times a day (QID) | INTRAMUSCULAR | Status: DC | PRN

## 2016-03-05 MED ORDER — IPRATROPIUM-ALBUTEROL 0.5-2.5 (3) MG/3ML IN SOLN
3.0000 mL | Freq: Four times a day (QID) | RESPIRATORY_TRACT | Status: DC
  Administered 2016-03-05 – 2016-03-06 (×4): 3 mL via RESPIRATORY_TRACT
  Filled 2016-03-05 (×4): qty 3

## 2016-03-05 MED ORDER — IPRATROPIUM-ALBUTEROL 0.5-2.5 (3) MG/3ML IN SOLN
3.0000 mL | RESPIRATORY_TRACT | Status: DC | PRN
  Administered 2016-03-07: 3 mL via RESPIRATORY_TRACT
  Filled 2016-03-05 (×2): qty 3

## 2016-03-05 NOTE — Progress Notes (Signed)
PT PROFILE: 59 F smoker with previously undiagnosed emphysema (appears severe by CXR) admitted with AECOPD and influenza A positive.Remains in ICU/SDU due to requirement for HFNC  SUBJ No new complaints. Feels better  Vitals:   03/05/16 1100 03/05/16 1200 03/05/16 1300 03/05/16 1400  BP:  104/72    Pulse: 76 77 77 75  Resp: 14 15 12 16   Temp:    97.9 F (36.6 C)  TempSrc:    Oral  SpO2: (!) 83% 90% 91% 90%  Weight:      Height:       NAD HEENT WNL No JVD Decreased BS throughout, no wheezes or other adventitious sounds Reg, no M NABS, soft No C/C/E  BMP Latest Ref Rng & Units 03/05/2016 03/03/2016 03/02/2016  Glucose 65 - 99 mg/dL 122(H) 148(H) 134(H)  BUN 6 - 20 mg/dL 26(H) 17 18  Creatinine 0.44 - 1.00 mg/dL 0.47 0.49 0.68  Sodium 135 - 145 mmol/L 132(L) 135 132(L)  Potassium 3.5 - 5.1 mmol/L 4.5 4.4 4.4  Chloride 101 - 111 mmol/L 95(L) 98(L) 94(L)  CO2 22 - 32 mmol/L 30 31 28   Calcium 8.9 - 10.3 mg/dL 8.3(L) 8.4(L) 8.5(L)   CBC Latest Ref Rng & Units 03/05/2016 03/03/2016 03/02/2016  WBC 3.6 - 11.0 K/uL 18.4(H) 4.1 9.4  Hemoglobin 12.0 - 16.0 g/dL 13.0 12.3 14.9  Hematocrit 35.0 - 47.0 % 40.1 37.0 43.3  Platelets 150 - 440 K/uL 295 172 189   CXR: hyperinflated, hyperlucent, no acute findings  IMPRESSION: Smoker Emphysema - likely severe based on CXR but pt very well compensated Acute influenza A Acute hypoxic respiratory failure - unclear why her O2 requirements are so high   PLAN/REC: Continue PRN BiPAP, supplemental O2 to maintain SpO2 > 88%, systemic steroids, nebulized steroids and bronchodilators, oseltamivir  Once she can tolerate off HFNC, will transfer out of ICU/SDU  Merton Border, MD PCCM service Mobile [phone] Pager [phone] 03/05/2016

## 2016-03-05 NOTE — Plan of Care (Signed)
Problem: Pain Managment: Goal: General experience of comfort will improve Outcome: Progressing Pt denies discomfort.  Problem: Physical Regulation: Goal: Ability to maintain clinical measurements within normal limits will improve Outcome: Progressing Weaned off of BiPap to [place].  Problem: Skin Integrity: Goal: Risk for impaired skin integrity will decrease Outcome: Progressing Skin integrity maintained.  Pt will need to improve nutritional intake to decrease risk for impaired skin integrity.  Cont to encourage adequate po intake.  Encourage family to bring in favorite foods from home.  Problem: Nutrition: Goal: Adequate nutrition will be maintained Outcome: Progressing Encouraging po intake.  Appetite remains poor/fair.

## 2016-03-05 NOTE — Progress Notes (Addendum)
0501 Pt anxious and wanting BiPap off.   Encouraged pt to cont to wear BiPap.  CCU NP paged for medication to help pt tolerate BiPap.  Morphine 2mg  IV ordered.   0530  After morphine 2mg  IV administered.  Pt resting comfortably w/dtr at bedside.  Tolerating BiPap well. RR has now decreased from 23/24 to 15/16.  SaO2 98%.

## 2016-03-05 NOTE — Progress Notes (Signed)
Pt asleep and SaO2 decreased to 81%.  Lungs significantly diminished than earlier, only occasional air movement heard, RR14-18.   Went to place BiPap on pt but pt's daughter requested I try the high flow oxygen first.  High flow placed on pt, w/o any improvement.  SaO2 remained 81-83%.  Bipap placed on pt w/ a very slow recovery to 89% on 80% O2.  RT paged and responded, turned BiPap O2 up to 100%.  Sa O2 now 91% w/RR=16.

## 2016-03-06 MED ORDER — PREDNISONE 20 MG PO TABS
40.0000 mg | ORAL_TABLET | Freq: Every day | ORAL | Status: DC
Start: 2016-03-07 — End: 2016-03-11
  Administered 2016-03-08 – 2016-03-11 (×4): 40 mg via ORAL
  Filled 2016-03-06 (×5): qty 2

## 2016-03-06 MED ORDER — ALPRAZOLAM 0.25 MG PO TABS
0.2500 mg | ORAL_TABLET | Freq: Three times a day (TID) | ORAL | Status: DC | PRN
  Administered 2016-03-06 – 2016-03-07 (×2): 0.25 mg via ORAL
  Filled 2016-03-06 (×2): qty 1

## 2016-03-06 MED ORDER — PNEUMOCOCCAL VAC POLYVALENT 25 MCG/0.5ML IJ INJ
0.5000 mL | INJECTION | INTRAMUSCULAR | Status: AC
  Administered 2016-03-08: 09:00:00 0.5 mL via INTRAMUSCULAR
  Filled 2016-03-06: qty 0.5

## 2016-03-06 MED ORDER — IBUPROFEN 200 MG PO TABS
400.0000 mg | ORAL_TABLET | Freq: Four times a day (QID) | ORAL | Status: DC | PRN
  Administered 2016-03-07 – 2016-03-10 (×8): 400 mg via ORAL
  Filled 2016-03-06 (×8): qty 2

## 2016-03-06 MED ORDER — UMECLIDINIUM-VILANTEROL 62.5-25 MCG/INH IN AEPB
1.0000 | INHALATION_SPRAY | Freq: Every day | RESPIRATORY_TRACT | Status: DC
  Administered 2016-03-06 – 2016-03-11 (×6): 1 via RESPIRATORY_TRACT
  Filled 2016-03-06: qty 14

## 2016-03-06 NOTE — Progress Notes (Signed)
Patient ID: Cristina Hale, female   DOB: Sep 17, 1960, 57 y.o.   MRN: [id number]  Case signed out by the critical care specialist to the medicine team. Medicine team will take over care tomorrow morning at 7 AM.

## 2016-03-06 NOTE — Progress Notes (Addendum)
PT PROFILE: 28 F smoker with previously undiagnosed emphysema (appears severe by CXR) admitted with AECOPD and influenza A positive.Remains in ICU/SDU due to requirement for HFNC  SUBJ No new complaints. Stillrequiring HFNC  Vitals:   03/06/16 1300 03/06/16 1400 03/06/16 1500 03/06/16 1651  BP:    (!) 151/68  Pulse: 81 83 83 82  Resp: (!) 21 16 17  (!) 24  Temp:    97.9 F (36.6 C)  TempSrc:    Oral  SpO2: (!) 85% (!) 89% (!) 89% 95%  Weight:      Height:       NAD HEENT WNL No JVD Decreased BS throughout, no wheezes or other adventitious sounds Reg, no M NABS, soft No C/C/E  BMP Latest Ref Rng & Units 03/05/2016 03/03/2016 03/02/2016  Glucose 65 - 99 mg/dL 122(H) 148(H) 134(H)  BUN 6 - 20 mg/dL 26(H) 17 18  Creatinine 0.44 - 1.00 mg/dL 0.47 0.49 0.68  Sodium 135 - 145 mmol/L 132(L) 135 132(L)  Potassium 3.5 - 5.1 mmol/L 4.5 4.4 4.4  Chloride 101 - 111 mmol/L 95(L) 98(L) 94(L)  CO2 22 - 32 mmol/L 30 31 28   Calcium 8.9 - 10.3 mg/dL 8.3(L) 8.4(L) 8.5(L)   CBC Latest Ref Rng & Units 03/05/2016 03/03/2016 03/02/2016  WBC 3.6 - 11.0 K/uL 18.4(H) 4.1 9.4  Hemoglobin 12.0 - 16.0 g/dL 13.0 12.3 14.9  Hematocrit 35.0 - 47.0 % 40.1 37.0 43.3  Platelets 150 - 440 K/uL 295 172 189   CXR: no new film  IMPRESSION: Smoker Emphysema - likely severe based on CXR but pt very well compensated Acute influenza A Refractory hypoxemia without CXR correlate to explain it   PLAN/REC: -Continue HFNC - wean as tolerated -Complete course of oseltamivir -Begin Anoro inhaler which should be her maintenance medication after discharge -Cont PRN nebulized bronchodilators -Change steroids to PO and taper to off over next 3-5 days -Check Echocardiogram with bubble study (ordered 02/04) to assess for R>L shunt -Transfer to med-surg. Even though her FiO2 requirements are high, she has been remarkably stable for several days ad can transfer safely -[facility] to assume her care as of AM 02/05.  Discussed with Dr Earleen Newport. PCCM will continue to see as pulmonary consultants and will arrange for office follow up after discharge  Merton Border, MD PCCM service Mobile [phone] Pager [phone] 03/06/2016

## 2016-03-06 NOTE — Plan of Care (Signed)
Problem: Safety: Goal: Ability to remain free from injury will improve Outcome: Progressing Pt remains safe, without fall or injury.  Problem: Pain Managment: Goal: General experience of comfort will improve Outcome: Progressing Pt remains pain free.  Problem: Physical Regulation: Goal: Ability to maintain clinical measurements within normal limits will improve Outcome: Progressing Tolerating HFNC well.  Not requiring to go back to the BiPap at this time. VS WNL. Goal: Will remain free from infection Outcome: Progressing Taking Tamiflu as prescribed.  Afebrile.  Problem: Skin Integrity: Goal: Risk for impaired skin integrity will decrease Outcome: Progressing Skin intact.  Nutritional intake improving.  Activity tolerance improving.  Was able to get OOB to [facility] today. Turning in bed independently.  Problem: Activity: Goal: Risk for activity intolerance will decrease Outcome: Progressing Tolerated getting OOB to [facility] today.  Problem: Fluid Volume: Goal: Ability to maintain a balanced intake and output will improve Outcome: Progressing PO intake increasing.  Pt reporting increased enjoyment with eating and increasing appetite.  Problem: Nutrition: Goal: Adequate nutrition will be maintained Outcome: Progressing Improving PO intake.  Problem: Bowel/Gastric: Goal: Will not experience complications related to bowel motility Outcome: Progressing Last BM 03/05/2016.  Pt denies constipation.

## 2016-03-07 ENCOUNTER — Inpatient Hospital Stay
Admit: 2016-03-07 | Discharge: 2016-03-07 | Disposition: A | Discharge status: Home / Self Care | Payer: BC Managed Care – PPO | Attending: Pulmonary Disease | Admitting: Pulmonary Disease

## 2016-03-07 DIAGNOSIS — J9602 Acute respiratory failure with hypercapnia: Secondary | ICD-10-CM

## 2016-03-07 MED ORDER — ENSURE ENLIVE PO LIQD
237.0000 mL | Freq: Two times a day (BID) | ORAL | Status: DC
  Administered 2016-03-08 – 2016-03-09 (×4): 237 mL via ORAL

## 2016-03-07 NOTE — Progress Notes (Signed)
*  PRELIMINARY RESULTS* Echocardiogram 2D Echocardiogram has been performed.  Sherrie Sport 03/07/2016, 3:09 PM

## 2016-03-07 NOTE — Progress Notes (Signed)
Nutrition Follow-up  DOCUMENTATION CODES:   Underweight  INTERVENTION:  Discontinued Boost Breeze per patient/family request.  Provide Ensure Enlive po BID, each supplement provides 350 kcal and 20 grams of protein.  Provide Magic cup TID with meals, each supplement provides 290 kcal and 9 grams of protein. Patient prefers orange flavor.  NUTRITION DIAGNOSIS:   Inadequate oral intake related to acute illness as evidenced by per patient/family report.  Ongoing.  GOAL:   Patient will meet greater than or equal to 90% of their needs  Not met.  MONITOR:   PO intake, Supplement acceptance, Labs, Weight trends  REASON FOR ASSESSMENT:   Malnutrition Screening Tool    ASSESSMENT:    56 yo female admitted with acute respiratory distress with COPD, influenza A.  Patient sleeping at time of follow-up but spoke with patient's daughter at bedside. She reports up until today patient has been eating approximately 25-33% of three meals daily. She does not like the Boost Breeze so she does not want to drink those any longer. She has been sleeping most of the day today so she has not had any breakfast or lunch. Denies N/V or abdominal pain. Patient still with difficulty breathing and is on HFNC.  Meal Completion: not recorded in chart since 2/2  Medications reviewed and include: Tamiflu, prednisone 40 mg daily.  Labs reviewed: Sodium 132, Chloride 95, BUN 26.   Discussed with RN.   Diet Order:  Diet regular Room service appropriate? Yes; Fluid consistency: Thin  Skin:  Reviewed, no issues  Last BM:  03/05/2016  Height:   Ht Readings from Last 1 Encounters:  03/06/16 5' 2" (1.575 m)    Weight:   Wt Readings from Last 1 Encounters:  03/06/16 89 lb 12.8 oz (40.7 kg)    Ideal Body Weight:  50 kg  BMI:  Body mass index is 16.42 kg/m.  Estimated Nutritional Needs:   Kcal:  1400-1800 kcals  Protein:  70-85 g  Fluid:  >/= 1.4 L  EDUCATION NEEDS:   No education  needs identified at this time  Willey Blade, MS, RD, LDN Pager: [phone] After Hours Pager: [phone]

## 2016-03-07 NOTE — Progress Notes (Signed)
*   [facility]     Assessment and Plan:  56 F smoker with previously undiagnosed emphysema (appears severe by CXR) admitted with AECOPD and influenza A positive.  COPD exacerbation, with acute influenza A bronchitis. -Continue duo nebs, can DC with Anoro inhaler once daily, prednisone taper. --Currently on 60% hi-flow, not previously on oxygen. Wean down as tolerated.  -Chest x-ray images. 2./3/18 reviewed: Severe hyperinflation consistent with emphysema. -Blood gas 03/02/16: 7.4/50/101/31 ; consistent with mild compensated hypercapnic respiratory failure. --Limit sedatives.   Date: 03/07/2016  MRN# [id number] Cristina Hale 09-11-1960   Cristina Hale is a 56 y.o. old female seen in follow up for chief complaint of  Chief Complaint  Patient presents with  . Respiratory Distress  . Cough     HPI:   Pt feels about the same, but better than 2 days ago. She is lethargic today, her daughter feels it is from a xanax.   Medication:   Reviewed.   Allergies:  Patient has no known allergies.  Review of Systems: Gen:  Denies  fever, sweats. HEENT: Denies blurred vision. Cvc:  No dizziness, chest pain or heaviness Resp:   Denies cough or sputum porduction. Gi: Denies swallowing difficulty, stomach pain. constipation, bowel incontinence Gu:  Denies bladder incontinence, burning urine  Physical Examination:   VS: BP 119/62 (BP Location: Right Arm)   Pulse 92   Temp 98.5 F (36.9 C) (Oral)   Resp 16   Ht 5\' 2"  (1.575 m)   Wt 40.7 kg (89 lb 12.8 oz)   SpO2 91%   BMI 16.42 kg/m   General Appearance: No distress  Neuro:without focal findings,  speech normal,  HEENT: PERRLA, EOM intact. Pulmonary: normal breath sounds, decreased air entry in both lower lobes.   CardiovascularNormal S1,S2.  No m/r/g.   Abdomen: Benign, Soft, non-tender. Renal:  No costovertebral tenderness  GU:  Not performed at this time. Endoc: No evident thyromegaly, no signs of  acromegaly. Skin:   warm, no rash. Extremities: normal, no cyanosis, clubbing.   LABORATORY PANEL:   CBC  Recent Labs Lab 03/05/16 0708  WBC 18.4*  HGB 13.0  HCT 40.1  PLT 295   ------------------------------------------------------------------------------------------------------------------  Chemistries   Recent Labs Lab 03/03/16 0558 03/05/16 0708  NA 135 132*  K 4.4 4.5  CL 98* 95*  CO2 31 30  GLUCOSE 148* 122*  BUN 17 26*  CREATININE 0.49 0.47  CALCIUM 8.4* 8.3*  MG 2.3  --    ------------------------------------------------------------------------------------------------------------------  Cardiac Enzymes  Recent Labs Lab 03/02/16 1727  TROPONINI <0.03   ------------------------------------------------------------  RADIOLOGY:   No results found for this or any previous visit. No results found for this or any previous visit. ------------------------------------------------------------------------------------------------------------------  Thank  you for allowing [facility] to assist in the care of your patient. Our recommendations are noted above.  Please contact us if we can be of further service.   Marda Stalker, MD.  [facility] Office Number: [phone]  Patricia Pesa, M.D.  Vilinda Boehringer, M.D.  Merton Border, M.D  03/07/2016

## 2016-03-07 NOTE — Progress Notes (Signed)
[facility] [place] NAME: Cristina Hale    MR#:  [id number]  DATE OF BIRTH:  March 08, 1960  SUBJECTIVE:  CHIEF COMPLAINT:   Chief Complaint  Patient presents with  . Respiratory Distress  . Cough   Came with respi failure, Influenza, COPD, remains on HFNC, but very stable, so transferred out of ICU. She had some agitation earlier but after receiving Xanax, more calm.  REVIEW OF SYSTEMS:  CONSTITUTIONAL: No fever, fatigue or weakness.  EYES: No blurred or double vision.  EARS, NOSE, AND THROAT: No tinnitus or ear pain.  RESPIRATORY: No cough, shortness of breath, wheezing or hemoptysis.  CARDIOVASCULAR: No chest pain, orthopnea, edema.  GASTROINTESTINAL: No nausea, vomiting, diarrhea or abdominal pain.  GENITOURINARY: No dysuria, hematuria.  ENDOCRINE: No polyuria, nocturia,  HEMATOLOGY: No anemia, easy bruising or bleeding SKIN: No rash or lesion. MUSCULOSKELETAL: No joint pain or arthritis.   NEUROLOGIC: No tingling, numbness, weakness.  PSYCHIATRY: No anxiety or depression.   ROS  DRUG ALLERGIES:  No Known Allergies  VITALS:  Blood pressure 119/62, pulse 92, temperature 98.5 F (36.9 C), temperature source Oral, resp. rate 16, height 5\' 2"  (1.575 m), weight 40.7 kg (89 lb 12.8 oz), SpO2 91 %.  PHYSICAL EXAMINATION:  GENERAL:  56 y.o.-year-old patient lying in the bed with no acute distress.  EYES: Pupils equal, round, reactive to light and accommodation. No scleral icterus. Extraocular muscles intact.  HEENT: Head atraumatic, normocephalic. Oropharynx and nasopharynx clear.  NECK:  Supple, no jugular venous distention. No thyroid enlargement, no tenderness.  LUNGS: Normal breath sounds bilaterally, mild wheezing, no crepitation. positive use of accessory muscles of respiration. On HFNC oxygen supply. CARDIOVASCULAR: S1, S2 normal. No murmurs, rubs, or gallops.  ABDOMEN: Soft, nontender, nondistended. Bowel sounds present. No  organomegaly or mass.  EXTREMITIES: No pedal edema, cyanosis, or clubbing.  NEUROLOGIC: Cranial nerves II through XII are intact. Muscle strength 4/5 in all extremities. Sensation intact. Gait not checked.  PSYCHIATRIC: The patient is alert and oriented x 3.  SKIN: No obvious rash, lesion, or ulcer.   Physical Exam LABORATORY PANEL:   CBC  Recent Labs Lab 03/05/16 0708  WBC 18.4*  HGB 13.0  HCT 40.1  PLT 295   ------------------------------------------------------------------------------------------------------------------  Chemistries   Recent Labs Lab 03/03/16 0558 03/05/16 0708  NA 135 132*  K 4.4 4.5  CL 98* 95*  CO2 31 30  GLUCOSE 148* 122*  BUN 17 26*  CREATININE 0.49 0.47  CALCIUM 8.4* 8.3*  MG 2.3  --    ------------------------------------------------------------------------------------------------------------------  Cardiac Enzymes  Recent Labs Lab 03/02/16 1727  TROPONINI <0.03   ------------------------------------------------------------------------------------------------------------------  RADIOLOGY:  No results found.  ASSESSMENT AND PLAN:   Active Problems:   Acute respiratory failure (HCC)  * Ac hypoxic respi failure    Due to COPD exacerbation    Influenza A    IV steroids and Inhaled steroids provided- now on oral steroids.   Nebulized bronchodilator.   HFNC oxygen, try to taper.   Finished Course of tamiflu.   Checked Echocardiogram.- EF normal, No shunt on bubble study, Elevated right sided pessure due to pulmonary issues.  * Chronic smoking   Counseled to Quit smoking for 4 min.  * leukocytosis   Secondary to steroids.    All the records are reviewed and case discussed with Care Management/Social Workerr. Management plans discussed with the patient, family and they are in agreement.  CODE STATUS: full.  TOTAL TIME TAKING  CARE OF THIS PATIENT: 35 minutes.     POSSIBLE D/C IN 2-3 DAYS, DEPENDING ON CLINICAL  CONDITION.   Vaughan Basta M.D on 03/07/2016   Between 7am to 6pm - Pager - [phone]  After 6pm go to [email or web address] - password [email or web address] [facility]  Office  [phone]  CC: Primary care physician; No PCP Per Patient  Note: This dictation was prepared with Dragon dictation along with smaller phrase technology. Any transcriptional errors that result from this process are unintentional.

## 2016-03-08 NOTE — Plan of Care (Signed)
Problem: Physical Regulation: Goal: Will remain free from infection Outcome: Progressing Pt receiving Tamiflu  Problem: Activity: Goal: Risk for activity intolerance will decrease Outcome: Not Progressing Pt's 02 drops to low 80's with exertion.

## 2016-03-08 NOTE — Progress Notes (Signed)
[place] at [place] NAME: Cristina Hale    MR#:  [id number]  DATE OF BIRTH:  [birth year]  SUBJECTIVE:  CHIEF COMPLAINT:   Chief Complaint  Patient presents with  . Respiratory Distress  . Cough   Came with respi failure, Influenza, COPD, remains on HFNC, but very stable, so transferred out of ICU. She had some agitation earlier but after receiving Xanax, more calm.  still requiring HFNC oxygen, but feels she is gradually getting better.  REVIEW OF SYSTEMS:  CONSTITUTIONAL: No fever, fatigue or weakness.  EYES: No blurred or double vision.  EARS, NOSE, AND THROAT: No tinnitus or ear pain.  RESPIRATORY: No cough, shortness of breath, wheezing or hemoptysis.  CARDIOVASCULAR: No chest pain, orthopnea, edema.  GASTROINTESTINAL: No nausea, vomiting, diarrhea or abdominal pain.  GENITOURINARY: No dysuria, hematuria.  ENDOCRINE: No polyuria, nocturia,  HEMATOLOGY: No anemia, easy bruising or bleeding SKIN: No rash or lesion. MUSCULOSKELETAL: No joint pain or arthritis.   NEUROLOGIC: No tingling, numbness, weakness.  PSYCHIATRY: No anxiety or depression.   ROS  DRUG ALLERGIES:  No Known Allergies  VITALS:  Blood pressure 105/60, pulse 94, temperature 98 F (36.7 C), temperature source Oral, resp. rate 20, height 5\' 2"  (1.575 m), weight 40.7 kg (89 lb 12.8 oz), SpO2 (!) 89 %.  PHYSICAL EXAMINATION:  GENERAL:  56 y.o.-year-old patient lying in the bed with no acute distress.  EYES: Pupils equal, round, reactive to light and accommodation. No scleral icterus. Extraocular muscles intact.  HEENT: Head atraumatic, normocephalic. Oropharynx and nasopharynx clear.  NECK:  Supple, no jugular venous distention. No thyroid enlargement, no tenderness.  LUNGS: Normal breath sounds bilaterally, mild wheezing, no crepitation. positive use of accessory muscles of respiration. On HFNC oxygen supply. CARDIOVASCULAR: S1, S2 normal. No murmurs, rubs, or  gallops.  ABDOMEN: Soft, nontender, nondistended. Bowel sounds present. No organomegaly or mass.  EXTREMITIES: No pedal edema, cyanosis, or clubbing.  NEUROLOGIC: Cranial nerves II through XII are intact. Muscle strength 4/5 in all extremities. Sensation intact. Gait not checked.  PSYCHIATRIC: The patient is alert and oriented x 3.  SKIN: No obvious rash, lesion, or ulcer.   Physical Exam LABORATORY PANEL:   CBC  Recent Labs Lab 03/05/16 0708  WBC 18.4*  HGB 13.0  HCT 40.1  PLT 295   ------------------------------------------------------------------------------------------------------------------  Chemistries   Recent Labs Lab 03/03/16 0558 03/05/16 0708  NA 135 132*  K 4.4 4.5  CL 98* 95*  CO2 31 30  GLUCOSE 148* 122*  BUN 17 26*  CREATININE 0.49 0.47  CALCIUM 8.4* 8.3*  MG 2.3  --    ------------------------------------------------------------------------------------------------------------------  Cardiac Enzymes  Recent Labs Lab 03/02/16 1727  TROPONINI <0.03   ------------------------------------------------------------------------------------------------------------------  RADIOLOGY:  No results found.  ASSESSMENT AND PLAN:   Active Problems:   Acute respiratory failure (HCC)  * Ac hypoxic respi failure    Due to COPD exacerbation, severe emphysema.    Influenza A    IV steroids and Inhaled steroids provided- now on oral steroids.   Nebulized bronchodilator. Added anoro as per pulm recommendation.   HFNC oxygen, try to taper.   Finished Course of tamiflu.   Checked Echocardiogram.- EF normal, No shunt on bubble study, Elevated right sided pessure due to pulmonary issues.  * Chronic smoking   Counseled to Quit smoking for 4 min.   on nicotine patch.  * leukocytosis   Secondary to steroids.    All the records are reviewed and  case discussed with Care Management/Social Workerr. Management plans discussed with the patient, family and they  are in agreement.  CODE STATUS: full.  TOTAL TIME TAKING CARE OF THIS PATIENT: 35 minutes.  reamains on high oxygen- cont to taper.   POSSIBLE D/C IN 2-3 DAYS, DEPENDING ON CLINICAL CONDITION.   Vaughan Basta M.D on 03/08/2016   Between 7am to 6pm - Pager - [phone]  After 6pm go to [email or web address] - password [email or web address] [facility]  Office  [phone]  CC: Primary care physician; No PCP Per Patient  Note: This dictation was prepared with Dragon dictation along with smaller phrase technology. Any transcriptional errors that result from this process are unintentional.

## 2016-03-09 LAB — BASIC METABOLIC PANEL
Anion gap: 6 (ref 5–15)
BUN: 35 mg/dL — AB (ref 6–20)
CALCIUM: 8.5 mg/dL — AB (ref 8.9–10.3)
CHLORIDE: 97 mmol/L — AB (ref 101–111)
CO2: 30 mmol/L (ref 22–32)
CREATININE: 0.78 mg/dL (ref 0.44–1.00)
GFR calc non Af Amer: >60 mL/min (ref >60)
Glucose, Bld: 112 mg/dL — ABNORMAL HIGH (ref 65–99)
Potassium: 3.9 mmol/L (ref 3.5–5.1)
SODIUM: 133 mmol/L — AB (ref 135–145)

## 2016-03-09 LAB — CBC
HCT: 37.7 % (ref 35.0–47.0)
HEMOGLOBIN: 12.7 g/dL (ref 12.0–16.0)
MCH: 28.8 pg (ref 26.0–34.0)
MCHC: 33.7 g/dL (ref 32.0–36.0)
MCV: 85.3 fL (ref 80.0–100.0)
PLATELETS: 313 10*3/uL (ref 150–440)
RBC: 4.42 MIL/uL (ref 3.80–5.20)
RDW: 14.8 % — AB (ref 11.5–14.5)
WBC: 27.7 10*3/uL — ABNORMAL HIGH (ref 3.6–11.0)

## 2016-03-09 MED ORDER — INFLUENZA VAC SPLIT QUAD 0.5 ML IM SUSY
0.5000 mL | PREFILLED_SYRINGE | INTRAMUSCULAR | Status: AC
  Administered 2016-03-10: 0.5 mL via INTRAMUSCULAR
  Filled 2016-03-09: qty 0.5

## 2016-03-09 NOTE — Progress Notes (Signed)
* [facility]     Assessment and Plan:  32 F smoker with previously undiagnosed emphysema (appears severe by CXR) admitted with AECOPD and influenza A positive.  COPD exacerbation, with acute influenza A bronchitis. -Continue duo nebs, can DC with Anoro inhaler once daily, prednisone taper. --Currently on 5L [place] not previously on oxygen. Wean down as tolerated.  -Chest x-ray images. 2./3/18 reviewed: Severe hyperinflation consistent with emphysema. --Limit sedatives.   OK to DC from respiratory standpoint, pt is asked to follow my office in 2-4 weeks, my office will call her to arrange, she is asked to call sooner with any questions or problems.  Will sign off for now, please call with any questions of concerns.    Date: 03/09/2016  MRN# [id number] Cristina Hale 03-17-1960   Cristina Hale is a 56 y.o. old female seen in follow up for chief complaint of  Chief Complaint  Patient presents with  . Respiratory Distress  . Cough     HPI:   Pt feels about the same, but better than 2 days ago. She is lethargic today, her daughter feels it is from a xanax.   Medication:   Reviewed.   Allergies:  Patient has no known allergies.  Review of Systems: Gen:  Denies  fever, sweats. HEENT: Denies blurred vision. Cvc:  No dizziness, chest pain or heaviness Resp:   Denies cough or sputum porduction. Gi: Denies swallowing difficulty, stomach pain. constipation, bowel incontinence Gu:  Denies bladder incontinence, burning urine  Physical Examination:   VS: BP (!) 99/50 (BP Location: Right Arm)   Pulse 76   Temp 98 F (36.7 C) (Oral)   Resp 18   Ht 5\' 2"  (1.575 m)   Wt 40.7 kg (89 lb 12.8 oz)   SpO2 95%   BMI 16.42 kg/m   General Appearance: No distress  Neuro:without focal findings,  speech normal,  HEENT: PERRLA, EOM intact. Pulmonary: normal breath sounds, decreased air entry in both lower lobes.   CardiovascularNormal S1,S2.  No m/r/g.   Abdomen:  Benign, Soft, non-tender. Renal:  No costovertebral tenderness  GU:  Not performed at this time. Endoc: No evident thyromegaly, no signs of acromegaly. Skin:   warm, no rash. Extremities: normal, no cyanosis, clubbing.   LABORATORY PANEL:   CBC  Recent Labs Lab 03/09/16 0359  WBC 27.7*  HGB 12.7  HCT 37.7  PLT 313   ------------------------------------------------------------------------------------------------------------------  Chemistries   Recent Labs Lab 03/03/16 0558  03/09/16 0359  NA 135  < > 133*  K 4.4  < > 3.9  CL 98*  < > 97*  CO2 31  < > 30  GLUCOSE 148*  < > 112*  BUN 17  < > 35*  CREATININE 0.49  < > 0.78  CALCIUM 8.4*  < > 8.5*  MG 2.3  --   --   < > = values in this interval not displayed. ------------------------------------------------------------------------------------------------------------------  Cardiac Enzymes  Recent Labs Lab 03/02/16 1727  TROPONINI <0.03   ------------------------------------------------------------  RADIOLOGY:   No results found for this or any previous visit. No results found for this or any previous visit. ------------------------------------------------------------------------------------------------------------------  Thank  you for allowing [facility] to assist in the care of your patient. Our recommendations are noted above.  Please contact us if we can be of further service.   Marda Stalker, MD.  [facility] Office Number: [phone]  Patricia Pesa, M.D.  Vishal  Stevenson Clinch, M.D.  Merton Border, M.D  03/09/2016

## 2016-03-09 NOTE — Progress Notes (Signed)
[facility] at [place] NAME: Cristina Hale    MR#:  [id number]  DATE OF BIRTH:  12/19/1960  SUBJECTIVE:  CHIEF COMPLAINT:   Chief Complaint  Patient presents with  . Respiratory Distress  . Cough   Came with respi failure, Influenza, COPD, remains on HFNC, but very stable, so transferred out of ICU. She had some agitation earlier but after receiving Xanax, more calm.  Was requiring HFNC oxygen, but feels she is gradually getting better.  today able to taper her off to nasal canula.  REVIEW OF SYSTEMS:  CONSTITUTIONAL: No fever, fatigue or weakness.  EYES: No blurred or double vision.  EARS, NOSE, AND THROAT: No tinnitus or ear pain.  RESPIRATORY: No cough, shortness of breath, wheezing or hemoptysis.  CARDIOVASCULAR: No chest pain, orthopnea, edema.  GASTROINTESTINAL: No nausea, vomiting, diarrhea or abdominal pain.  GENITOURINARY: No dysuria, hematuria.  ENDOCRINE: No polyuria, nocturia,  HEMATOLOGY: No anemia, easy bruising or bleeding SKIN: No rash or lesion. MUSCULOSKELETAL: No joint pain or arthritis.   NEUROLOGIC: No tingling, numbness, weakness.  PSYCHIATRY: No anxiety or depression.   ROS  DRUG ALLERGIES:  No Known Allergies  VITALS:  Blood pressure (!) 93/50, pulse 89, temperature 98 F (36.7 C), temperature source Oral, resp. rate 18, height 5\' 2"  (1.575 m), weight 40.7 kg (89 lb 12.8 oz), SpO2 91 %.  PHYSICAL EXAMINATION:  GENERAL:  56 y.o.-year-old patient lying in the bed with no acute distress.  EYES: Pupils equal, round, reactive to light and accommodation. No scleral icterus. Extraocular muscles intact.  HEENT: Head atraumatic, normocephalic. Oropharynx and nasopharynx clear.  NECK:  Supple, no jugular venous distention. No thyroid enlargement, no tenderness.  LUNGS: Normal breath sounds bilaterally, mild wheezing, no crepitation. positive use of accessory muscles of respiration. On [place] oxygen  supply. CARDIOVASCULAR: S1, S2 normal. No murmurs, rubs, or gallops.  ABDOMEN: Soft, nontender, nondistended. Bowel sounds present. No organomegaly or mass.  EXTREMITIES: No pedal edema, cyanosis, or clubbing.  NEUROLOGIC: Cranial nerves II through XII are intact. Muscle strength 4/5 in all extremities. Sensation intact. Gait not checked.  PSYCHIATRIC: The patient is alert and oriented x 3.  SKIN: No obvious rash, lesion, or ulcer.   Physical Exam LABORATORY PANEL:   CBC  Recent Labs Lab 03/09/16 0359  WBC 27.7*  HGB 12.7  HCT 37.7  PLT 313   ------------------------------------------------------------------------------------------------------------------  Chemistries   Recent Labs Lab 03/03/16 0558  03/09/16 0359  NA 135  < > 133*  K 4.4  < > 3.9  CL 98*  < > 97*  CO2 31  < > 30  GLUCOSE 148*  < > 112*  BUN 17  < > 35*  CREATININE 0.49  < > 0.78  CALCIUM 8.4*  < > 8.5*  MG 2.3  --   --   < > = values in this interval not displayed. ------------------------------------------------------------------------------------------------------------------  Cardiac Enzymes  Recent Labs Lab 03/02/16 1727  TROPONINI <0.03   ------------------------------------------------------------------------------------------------------------------  RADIOLOGY:  No results found.  ASSESSMENT AND PLAN:   Active Problems:   Acute respiratory failure (HCC)  * Ac hypoxic respi failure    Due to COPD exacerbation, severe emphysema.    Influenza A    IV steroids and Inhaled steroids provided- now on oral steroids.   Nebulized bronchodilator. Added anoro as per pulm recommendation.   HFNC oxygen, try to taper.   Finished Course of tamiflu.   Checked Echocardiogram.- EF normal, No shunt  on bubble study, Elevated right sided pessure due to pulmonary issues.   Now on nasal canula 6 ltr oxygen, monitor today, and possible d/c tomorrow with further lower oxygen.  * Chronic smoking    Counseled to Quit smoking for 4 min.   on nicotine patch.  * leukocytosis   Secondary to steroids.    All the records are reviewed and case discussed with Care Management/Social Workerr. Management plans discussed with the patient, family and they are in agreement.  CODE STATUS: full.  TOTAL TIME TAKING CARE OF THIS PATIENT: 35 minutes.  reamains on oxygen- cont to taper.   POSSIBLE D/C IN 1-2 DAYS, DEPENDING ON CLINICAL CONDITION.   Vaughan Basta M.D on 03/09/2016   Between 7am to 6pm - Pager - [phone]  After 6pm go to [email or web address] - password [email or web address] [facility]  Office  [phone]  CC: Primary care physician; No PCP Per Patient  Note: This dictation was prepared with Dragon dictation along with smaller phrase technology. Any transcriptional errors that result from this process are unintentional.

## 2016-03-09 NOTE — Telephone Encounter (Signed)
lmov to schedule new Patient appt. From referral

## 2016-03-09 NOTE — Progress Notes (Signed)
Placed patient on a 6l [place] from HFNC of 40l and 40%. Tol well with sats of 93-94% at this time. Will continue to monitor.

## 2016-03-10 ENCOUNTER — Telehealth: Payer: Self-pay | Admitting: Pulmonary Disease

## 2016-03-10 ENCOUNTER — Telehealth: Payer: Self-pay | Admitting: *Deleted

## 2016-03-10 MED ORDER — BENZONATATE 100 MG PO CAPS
200.0000 mg | ORAL_CAPSULE | Freq: Two times a day (BID) | ORAL | Status: DC | PRN
  Administered 2016-03-10 – 2016-03-11 (×3): 200 mg via ORAL
  Filled 2016-03-10: qty 1
  Filled 2016-03-10 (×3): qty 2

## 2016-03-10 MED ORDER — HYDROCOD POLST-CPM POLST ER 10-8 MG/5ML PO SUER
5.0000 mL | Freq: Two times a day (BID) | ORAL | Status: DC
  Administered 2016-03-10 – 2016-03-11 (×3): 5 mL via ORAL
  Filled 2016-03-10 (×3): qty 5

## 2016-03-10 NOTE — Progress Notes (Signed)
[place] at [place] NAME: Cristina Hale    MR#:  [id number]  DATE OF BIRTH:  [birth year]  SUBJECTIVE:  CHIEF COMPLAINT:   Chief Complaint  Patient presents with  . Respiratory Distress  . Cough  Still having a lot of cough, she is weaned down to 4 L from 6 L, daughter at bedside requesting FMLA REVIEW OF SYSTEMS:  CONSTITUTIONAL: No fever, fatigue or weakness.  EYES: No blurred or double vision.  EARS, NOSE, AND THROAT: No tinnitus or ear pain.  RESPIRATORY: No cough, shortness of breath, wheezing or hemoptysis.  CARDIOVASCULAR: No chest pain, orthopnea, edema.  GASTROINTESTINAL: No nausea, vomiting, diarrhea or abdominal pain.  GENITOURINARY: No dysuria, hematuria.  ENDOCRINE: No polyuria, nocturia,  HEMATOLOGY: No anemia, easy bruising or bleeding SKIN: No rash or lesion. MUSCULOSKELETAL: No joint pain or arthritis.   NEUROLOGIC: No tingling, numbness, weakness.  PSYCHIATRY: No anxiety or depression.   ROS  DRUG ALLERGIES:  No Known Allergies  VITALS:  Blood pressure (!) 104/57, pulse 65, temperature 98.1 F (36.7 C), temperature source Oral, resp. rate 16, height 5\' 2"  (1.575 m), weight 40.7 kg (89 lb 12.8 oz), SpO2 91 %.  PHYSICAL EXAMINATION:  GENERAL:  56 y.o.-year-old patient lying in the bed with no acute distress.  EYES: Pupils equal, round, reactive to light and accommodation. No scleral icterus. Extraocular muscles intact.  HEENT: Head atraumatic, normocephalic. Oropharynx and nasopharynx clear.  NECK:  Supple, no jugular venous distention. No thyroid enlargement, no tenderness.  LUNGS: Normal breath sounds bilaterally, mild wheezing, no crepitation. positive use of accessory muscles of respiration. On [place] oxygen supply. CARDIOVASCULAR: S1, S2 normal. No murmurs, rubs, or gallops.  ABDOMEN: Soft, nontender, nondistended. Bowel sounds present. No organomegaly or mass.  EXTREMITIES: No pedal edema, cyanosis, or clubbing.   NEUROLOGIC: Cranial nerves II through XII are intact. Muscle strength 4/5 in all extremities. Sensation intact. Gait not checked.  PSYCHIATRIC: The patient is alert and oriented x 3.  SKIN: No obvious rash, lesion, or ulcer.   Physical Exam LABORATORY PANEL:   CBC  Recent Labs Lab 03/09/16 0359  WBC 27.7*  HGB 12.7  HCT 37.7  PLT 313   ------------------------------------------------------------------------------------------------------------------  Chemistries   Recent Labs Lab 03/09/16 0359  NA 133*  K 3.9  CL 97*  CO2 30  GLUCOSE 112*  BUN 35*  CREATININE 0.78  CALCIUM 8.5*   ------------------------------------------------------------------------------------------------------------------  Cardiac Enzymes No results for input(s): TROPONINI in the last 168 hours. ------------------------------------------------------------------------------------------------------------------  RADIOLOGY:  No results found.  ASSESSMENT AND PLAN:  56 yo female with PMH significant for tobacco abuse and COPD Is admitted for acute hypoxic respiratory failure  * Ac hypoxic respi failure    Due to COPD exacerbation, severe emphysema.  * Acute COPD exacerbation, severe emphysema.    Influenza A was positive -  Finished Course of tamiflu. -  on oral steroids.   Nebulized bronchodilator. Added anoro as per pulm recommendation.   HFNC oxygen, try to taper.   Echocardiogram.- EF normal, No shunt on bubble study, Elevated right sided pessure due to pulmonary issues.   Now on nasal canula 6->4 ltr oxygen, monitor today, and possible d/c tomorrow with further lower oxygen.  * Chronic smoking   Counseled to Quit smoking for 4 min.   on nicotine patch.  * leukocytosis   Secondary to steroids.   Physical therapy consultation  Patient does not have a primary care physician. I've requested to set up  outpatient follow-up with new PCP and Pulmo   All the records are reviewed and  case discussed with Care Management/Social Workerr. Management plans discussed with the patient, family and they are in agreement.  CODE STATUS: full.  TOTAL TIME TAKING CARE OF THIS PATIENT: 35 minutes.  reamains on oxygen- cont to taper.   POSSIBLE D/C IN 1-2 DAYS,Hopefully tomorrow, DEPENDING ON CLINICAL CONDITION.   Max Sane M.D on 03/10/2016   Between 7am to 6pm - Pager - [phone]  After 6pm go to [email or web address] - password [email or web address] [facility]  Office  [phone]  CC: Primary care physician; No PCP Per Patient  Note: This dictation was prepared with Dragon dictation along with smaller phrase technology. Any transcriptional errors that result from this process are unintentional.

## 2016-03-10 NOTE — Progress Notes (Addendum)
[place] at [facility] was admitted to the [facility] on 03/02/2016 and Discharged  03/11/2016 and should be excused from work for 14 days starting 03/02/2016 , may return to work without any restrictions on 2/15 &/after follow up with outpt doctor.  Max Sane M.D on 03/11/2016,at 11:09 AM  [place] at [facility]  [phone]

## 2016-03-10 NOTE — Progress Notes (Signed)
OT Cancellation Note  Patient Details Name: Cristina Hale MRN: [id number] DOB: Jun 16, 1960   Cancelled Treatment:    Reason Eval/Treat Not Completed: Patient at procedure or test/ unavailable (Pt. with nursing. Will attempt OT services again, at a later time/date.)  Harrel Carina, MS, OTR/L  03/10/2016, 1:10 PM

## 2016-03-10 NOTE — Telephone Encounter (Signed)
Would you address the HFU in establishing care appointment?

## 2016-03-10 NOTE — Evaluation (Signed)
Physical Therapy Evaluation Patient Details Name: Cristina Hale MRN: [id number] DOB: 1961/01/23 Today's Date: 03/10/2016   History of Present Illness  Pt admitted for ARF secondary to pneumonia and flu. Pt with history of tobacco abuse, COPD, and cervical cancer. Stay complicated by admission to CCU needing bipap, hi-flow and now on 3L of O2.  Clinical Impression  Pt is a pleasant 56 year old female who was admitted for ARF and flu. Pt performs bed mobility independently, transfers with mod I, and ambulation with cga and use of RW. Pt demonstrates deficits with strength/endurance/mobility. All mobility performed on 3L of O2 with decreased sats noted. Pt responds well to verbal cues for pursed lip breathing. May would benefit from flutter valve. Would benefit from skilled PT to address above deficits and promote optimal return to PLOF. Recommend transition to [place] upon discharge from acute hospitalization.       Follow Up Recommendations Home health PT    Equipment Recommendations       Recommendations for Other Services       Precautions / Restrictions Precautions Precautions: Fall Restrictions Weight Bearing Restrictions: No      Mobility  Bed Mobility Overal bed mobility: Independent             General bed mobility comments: safe technique performed  Transfers Overall transfer level: Modified independent Equipment used: Rolling walker (2 wheeled)             General transfer comment: safe technique performed with upright posture and RW used  Ambulation/Gait Ambulation/Gait assistance: Min guard Ambulation Distance (Feet): 40 Feet Assistive device: Rolling walker (2 wheeled) Gait Pattern/deviations: Step-through pattern     General Gait Details: ambulated using reciprocal gait pattern and correct use of RW. Pt fatigues quickly with O2 sats decreasing to 84% taking prolonged time to improve. Decreased endurance noted. All mobility peformed on 3L of  O2.  Stairs            Wheelchair Mobility    Modified Rankin (Stroke Patients Only)       Balance Overall balance assessment: Modified Independent (with RW)                                           Pertinent Vitals/Pain Pain Assessment: No/denies pain    Home Living Family/patient expects to be discharged to:: Private residence Living Arrangements: Spouse/significant other Available Help at Discharge: Family Type of Home: House Home Access: Stairs to enter Entrance Stairs-Rails: Can reach both Entrance Stairs-Number of Steps: 5 Home Layout: One level Home Equipment: Environmental consultant - 2 wheels (has RW from mother)      Prior Function Level of Independence: Independent         Comments: working full time, very independent prior     Journalist, newspaper        Extremity/Trunk Assessment   Upper Extremity Assessment Upper Extremity Assessment: Overall WFL for tasks assessed    Lower Extremity Assessment Lower Extremity Assessment: Generalized weakness (B LE grossly 4/5)       Communication   Communication: No difficulties  Cognition Arousal/Alertness: Awake/alert Behavior During Therapy: WFL for tasks assessed/performed Overall Cognitive Status: Within Functional Limits for tasks assessed                      General Comments      Exercises Other Exercises Other Exercises: Pt  ambulated to bathroom, needing cga for set up and hygiene. Safe technique performed and 3L of O2 donned   Assessment/Plan    PT Assessment Patient needs continued PT services  PT Problem List Decreased strength;Decreased activity tolerance;Decreased mobility;Cardiopulmonary status limiting activity          PT Treatment Interventions Gait training;DME instruction;Therapeutic exercise    PT Goals (Current goals can be found in the Care Plan section)  Acute Rehab PT Goals Patient Stated Goal: to go home PT Goal Formulation: With patient Time For Goal  Achievement: 03/24/16 Potential to Achieve Goals: Good    Frequency Min 2X/week   Barriers to discharge        Co-evaluation               End of Session Equipment Utilized During Treatment: Gait belt;Oxygen Activity Tolerance: Treatment limited secondary to medical complications (Comment) Patient left: in bed Nurse Communication: Mobility status         Time: [id number] PT Time Calculation (min) (ACUTE ONLY): 29 min   Charges:   PT Evaluation $PT Eval Moderate Complexity: 1 Procedure PT Treatments $Therapeutic Activity: 8-22 mins   PT G Codes:        Biagio Snelson March 26, 2016, 5:17 PM  Greggory Stallion, PT, DPT [phone]

## 2016-03-10 NOTE — Progress Notes (Signed)
Nutrition Follow-up  DOCUMENTATION CODES:   Underweight  INTERVENTION:  Discontinue Ensure Enlive and Magic Cup per patient request.  RD encouraged patient to continue snacks 2-3 times daily between meals to help meet calorie and protein needs. Discussed choosing snacks with adequate calories and a good source of protein.  NUTRITION DIAGNOSIS:   Inadequate oral intake related to acute illness as evidenced by per patient/family report.  Resolved - patient now meeting 100% of needs without ONS.  GOAL:   Patient will meet greater than or equal to 90% of their needs  Met.  MONITOR:   PO intake, Supplement acceptance, Labs, Weight trends  REASON FOR ASSESSMENT:   Malnutrition Screening Tool    ASSESSMENT:    56 yo female admitted with acute respiratory distress with COPD, influenza A.  -Per chart patient has weaned down to 4L [place].   Spoke with patient and daughter at bedside. Patient was sitting up in bed and appeared much more awake and energetic than on previous follow-up. Patient reports her appetite is much better. She reports she can taste food again and is even craving certain foods. Patient does not want to drink the Ensure Enlive or eat Magic Cup because she reports she does not like them. Patient has been eating snacks brought in by daughter between meals. She has trail mix nuts, and a "protein pack" with fruit, cheese, and peanut butter at bedside.   Meal Completion: 75-100% per patient report. Patient reports she had 75% of lunch yesterday, a snack of trail mix nuts, dinner of takeout Chinese food (egg drop soup, vegetables, and egg roll), and 100% of breakfast this morning. In the past 24 hours patient has had approximately 1675 kcal (100% estimated needs) and 74 grams of protein (100% estimated needs).   Medications reviewed and include: prednisone 40 mg daily.  Labs reviewed: Sodium 133 (trending up), Chloride 97 (trending up), BUN 35.   Diet Order:  Diet  regular Room service appropriate? Yes; Fluid consistency: Thin  Skin:  Reviewed, no issues  Last BM:  03/08/2016  Height:   Ht Readings from Last 1 Encounters:  03/06/16 5' 2" (1.575 m)    Weight:   Wt Readings from Last 1 Encounters:  03/06/16 89 lb 12.8 oz (40.7 kg)    Ideal Body Weight:  50 kg  BMI:  Body mass index is 16.42 kg/m.  Estimated Nutritional Needs:   Kcal:  1400-1800 kcals  Protein:  70-85 g  Fluid:  >/= 1.4 L  EDUCATION NEEDS:   No education needs identified at this time  Leanne Stephens, MS, RD, LDN Pager: [phone] After Hours Pager: [phone]  

## 2016-03-10 NOTE — Telephone Encounter (Signed)
FYI : Pt. has been scheduled with Dr. Lacinda Axon as a new patient on 03/11/16 at 1330. Pt will discharge from the hospital [facility]) on today. I explained to the nurse that pt has never been seen at this location and will have to establish care, I also added that I was not sure if a HFU could be addressed.

## 2016-03-10 NOTE — Telephone Encounter (Signed)
Please advise where to schedule hosp f/u in 1 week

## 2016-03-11 ENCOUNTER — Ambulatory Visit: Payer: BC Managed Care – PPO | Admitting: Family Medicine

## 2016-03-11 ENCOUNTER — Telehealth: Payer: Self-pay | Admitting: Internal Medicine

## 2016-03-11 MED ORDER — BENZONATATE 200 MG PO CAPS: 200.0000 mg | ORAL_CAPSULE | Freq: Two times a day (BID) | ORAL | 0 refills | Status: DC | PRN

## 2016-03-11 MED ORDER — TIOTROPIUM BROMIDE MONOHYDRATE 18 MCG IN CAPS: 18.0000 ug | ORAL_CAPSULE | Freq: Every day | RESPIRATORY_TRACT | 2 refills | Status: DC

## 2016-03-11 MED ORDER — FLUTICASONE-SALMETEROL 250-50 MCG/DOSE IN AEPB: 1.0000 | INHALATION_SPRAY | Freq: Two times a day (BID) | RESPIRATORY_TRACT | 0 refills | Status: DC

## 2016-03-11 MED ORDER — HYDROCOD POLST-CPM POLST ER 10-8 MG/5ML PO SUER: 5.0000 mL | Freq: Two times a day (BID) | ORAL | 0 refills | Status: DC

## 2016-03-11 MED ORDER — PREDNISONE 10 MG PO TABS: 5.0000 mg | ORAL_TABLET | Freq: Every day | ORAL | 0 refills | Status: DC

## 2016-03-11 NOTE — Evaluation (Signed)
Occupational Therapy Evaluation Patient Details Name: Harry Scherer MRN: [id number] DOB: 01-05-1961 Today's Date: 03/11/2016    History of Present Illness Pt. is a 56 y.o. female who was admitted to [facility] with Pneumonia, and Flu. Pt. PMHx includes: COPD, Tobacco Abuse, and Cervical CA.   Clinical Impression   Pt. Is a 56 y.o. Female who was admitted to [facility] with Pneumonia, and Flu. Pt. Presents with weakness, decreased activity tolerance, 3LO2, and impaired functional mobility which hinder her ability to complete ADl and IADL functioning. Pt. could benefit from skilled OT services for ADL training, A/E training, energy conservation/work simplification, and pt. education about home modification, and DME. Pt. education was provided about A/E use for LE ADLs. Pt. was provided with a visual  handout. Pt. Plans to return home upon discharge with family assist.    Follow Up Recommendations  No OT follow up    Equipment Recommendations       Recommendations for Other Services       Precautions / Restrictions Restrictions Weight Bearing Restrictions: No                                                     ADL Overall ADL's : Needs assistance/impaired Eating/Feeding: Set up   Grooming: Set up           Upper Body Dressing : Set up;Independent   Lower Body Dressing: Set up;Independent                 General ADL Comments: Pt. education was provided about energy conservation/work simplification skills.     Vision     Perception     Praxis      Pertinent Vitals/Pain Pain Assessment: No/denies pain     Hand Dominance Right   Extremity/Trunk Assessment Upper Extremity Assessment Upper Extremity Assessment: Overall WFL for tasks assessed           Communication Communication Communication: No difficulties   Cognition Arousal/Alertness: Awake/alert Behavior During Therapy: WFL for tasks assessed/performed Overall Cognitive Status:  Within Functional Limits for tasks assessed                     General Comments       Exercises       Shoulder Instructions      Home Living Family/patient expects to be discharged to:: Private residence Living Arrangements: Spouse/significant other Available Help at Discharge: Family Type of Home: House Home Access: Stairs to enter [employer] of Steps: In the back Entrance Stairs-Rails: Can reach both Home Layout: One level     Bathroom Shower/Tub: Tub/shower unit         Home Equipment: Environmental consultant - 2 wheels          Prior Functioning/Environment Level of Independence: Independent        Comments: Works full time, drives, Independent        OT Problem List: Decreased strength;Pain;Decreased knowledge of use of DME or AE;Impaired UE functional use;Decreased activity tolerance   OT Treatment/Interventions: Self-care/ADL training;Therapeutic exercise;Therapeutic activities;DME and/or AE instruction;Patient/family education;Visual/perceptual remediation/compensation    OT Goals(Current goals can be found in the care plan section) Acute Rehab OT Goals Patient Stated Goal: To return home OT Goal Formulation: With patient Potential to Achieve Goals: Good  OT Frequency: Min 1X/week   Barriers to  D/C:            Co-evaluation              End of Session    Activity Tolerance: Patient tolerated treatment well Patient left: in bed;with call bell/phone within reach;with bed alarm set;with family/visitor present   Time: [id number] OT Time Calculation (min): 22 min Charges:  OT General Charges $OT Visit: 1 Procedure OT Evaluation $OT Eval Moderate Complexity: 1 Procedure G-Codes:    Harrel Carina, MS, OTR/L 03/11/2016, 9:51 AM

## 2016-03-11 NOTE — Telephone Encounter (Signed)
Does this patient need to come in sooner for hospital follow up? Thanks!

## 2016-03-11 NOTE — Care Management (Signed)
Admitted to this facility with the diagnosis of respiratory distress and Flu+. Lives with husband. States she has no primary care provider. Information on physicians accepting new patients given to Ms. Stargell. States she will get new primary care and physician and is planning on following up with her pulmonary physician.  Qualified for home oxygen. Discussed agencies. [place]. Floydene Flock, [facility] representative updated. Declines Home Health and therapy at this time.   Discharge to home today per Dr. Danise Edge RN MSN CCM Care Management

## 2016-03-11 NOTE — Telephone Encounter (Signed)
Please call this patient and bring them on 03/23/16 @ 8am with [facility] please.

## 2016-03-11 NOTE — Progress Notes (Signed)
Pt  For discharge home with 02  At 3 l [place].   sats high 90s on 3 liters.  Iv site d/cd.  Instructions discussed with pt  And dtr.  presc and home meds discussed. Diet activity and f/u discussed.   Verbalized understanding of all.  Out via w/c at this time w/o  C/o.

## 2016-03-11 NOTE — Telephone Encounter (Signed)
She is still in the hospital. I will address her most pressing medical issues (essentially hospital follow up).

## 2016-03-11 NOTE — Telephone Encounter (Signed)
Message to schedulers to schedule per message from DR.

## 2016-03-11 NOTE — Discharge Instructions (Signed)
Acute Respiratory Failure, Adult Acute respiratory failure occurs when there is not enough oxygen passing from your lungs to your body. When this happens, your lungs have trouble removing carbon dioxide from the blood. This causes your blood oxygen level to drop too low as carbon dioxide builds up. Acute respiratory failure is a medical emergency. It can develop quickly, but it is temporary if treated promptly. Your lung capacity, or how much air your lungs can hold, may improve with time, exercise, and treatment. What are the causes? There are many possible causes of acute respiratory failure, including:  Lung injury.  Chest injury or damage to the ribs or tissues near the lungs.  Lung conditions that affect the flow of air and blood into and out of the lungs, such as pneumonia, acute respiratory distress syndrome, and cystic fibrosis.  Medical conditions, such as strokes or spinal cord injuries, that affect the muscles and nerves that control breathing.  Blood infection (sepsis).  Inflammation of the pancreas (pancreatitis).  A blood clot in the lungs (pulmonary embolism).  A large-volume blood transfusion.  Burns.  Near-drowning.  Seizure.  Smoke inhalation.  Reaction to medicines.  Alcohol or drug overdose. What increases the risk? This condition is more likely to develop in people who have:  A blocked airway.  Asthma.  A condition or disease that damages or weakens the muscles, nerves, bones, or tissues that are involved in breathing.  A serious infection.  A health problem that blocks the unconscious reflex that is involved in breathing, such as hypothyroidism or sleep apnea.  A lung injury or trauma. What are the signs or symptoms? Trouble breathing is the main symptom of acute respiratory failure. Symptoms may also include:  Rapid breathing.  Restlessness or anxiety.  Skin, lips, or fingernails that appear blue (cyanosis).  Rapid heart  rate.  Abnormal heart rhythms (arrhythmias).  Confusion or changes in behavior.  Tiredness or loss of energy.  Feeling sleepy or having a loss of consciousness. How is this diagnosed? Your health care provider can diagnose acute respiratory failure with a medical history and physical exam. During the exam, your health care provider will listen to your heart and check for crackling or wheezing sounds in your lungs. Your may also have tests to confirm the diagnosis and determine what is causing respiratory failure. These tests may include:  Measuring the amount of oxygen in your blood (pulse oximetry). The measurement comes from a small device that is placed on your finger, earlobe, or toe.  Other blood tests to measure blood gases and to look for signs of infection.  Sampling your cerebral spinal fluid or tracheal fluid to check for infections.  Chest X-ray to look for fluid in spaces that should be filled with air.  Electrocardiogram (ECG) to look at the heart's electrical activity. How is this treated? Treatment for this condition usually takes places in a hospital intensive care unit (ICU). Treatment depends on what is causing the condition. It may include one or more treatments until your symptoms improve. Treatment may include:  Supplemental oxygen. Extra oxygen is given through a tube in the nose, a face mask, or a hood.  A device such as a continuous positive airway pressure (CPAP) or bi-level positive airway pressure (BiPAP or BPAP) machine. This treatment uses mild air pressure to keep the airways open. A mask or other device will be placed over your nose or mouth. A tube that is connected to a motor will deliver oxygen through the  mask.  Ventilator. This treatment helps move air into and out of the lungs. This may be done with a bag and mask or a machine. For this treatment, a tube is placed in your windpipe (trachea) so air and oxygen can flow to the lungs.  Extracorporeal  membrane oxygenation (ECMO). This treatment temporarily takes over the function of the heart and lungs, supplying oxygen and removing carbon dioxide. ECMO gives the lungs a chance to recover. It may be used if a ventilator is not effective.  Tracheostomy. This is a procedure that creates a hole in the neck to insert a breathing tube.  Receiving fluids and medicines.  Rocking the bed to help breathing. Follow these instructions at home:  Take over-the-counter and prescription medicines only as told by your health care provider.  Return to normal activities as told by your health care provider. Ask your health care provider what activities are safe for you.  Keep all follow-up visits as told by your health care provider. This is important. How is this prevented? Treating infections and medical conditions that may lead to acute respiratory failure can help prevent the condition from developing. Contact a health care provider if:  You have a fever.  Your symptoms do not improve or they get worse. Get help right away if:  You are having trouble breathing.  You lose consciousness.  Your have cyanosis or turn blue.  You develop a rapid heart rate.  You are confused. These symptoms may represent a serious problem that is an emergency. Do not wait to see if the symptoms will go away. Get medical help right away. Call your local emergency services (911 in the [place] Do not drive yourself to the hospital.  This information is not intended to replace advice given to you by your health care provider. Make sure you discuss any questions you have with your health care provider. Document Released: 01/22/2013 Document Revised: 08/15/2015 Document Reviewed: 08/05/2015 Elsevier Interactive Patient Education  2017 [employer] Chronic Obstructive Pulmonary Disease Chronic obstructive pulmonary disease (COPD) is a common lung problem. In COPD, the flow of air from the lungs is limited. The way your  lungs work will probably never return to normal, but there are things you can do to improve your lungs and make yourself feel better. Your doctor may treat your condition with:  Medicines.  Oxygen.  Lung surgery.  Changes to your diet.  Rehabilitation. This may involve a team of specialists. Follow these instructions at home:  Take all medicines as told by your doctor.  Avoid medicines or cough syrups that dry up your airway (such as antihistamines) and do not allow you to get rid of thick spit. You do not need to avoid them if told differently by your doctor.  If you smoke, stop. Smoking makes the problem worse.  Avoid being around things that make your breathing worse (like smoke, chemicals, and fumes).  Use oxygen therapy and therapy to help improve your lungs (pulmonary rehabilitation) if told by your doctor. If you need home oxygen therapy, ask your doctor if you should buy a tool to measure your oxygen level (oximeter).  Avoid people who have a sickness you can catch (contagious).  Avoid going outside when it is very hot, cold, or humid.  Eat healthy foods. Eat smaller meals more often. Rest before meals.  Stay active, but remember to also rest.  Make sure to get all the shots (vaccines) your doctor recommends. Ask your doctor if you need  a pneumonia shot.  Learn and use tips on how to relax.  Learn and use tips on how to control your breathing as told by your doctor. Try: 1. Breathing in (inhaling) through your nose for 1 second. Then, pucker your lips and breath out (exhale) through your lips for 2 seconds. 2. Putting one hand on your belly (abdomen). Breathe in slowly through your nose for 1 second. Your hand on your belly should move out. Pucker your lips and breathe out slowly through your lips. Your hand on your belly should move in as you breathe out.  Learn and use controlled coughing to clear thick spit from your lungs. The steps are: 1. Lean your head a little  forward. 2. Breathe in deeply. 3. Try to hold your breath for 3 seconds. 4. Keep your mouth slightly open while coughing 2 times. 5. Spit any thick spit out into a tissue. 6. Rest and do the steps again 1 or 2 times as needed. Contact a doctor if:  You cough up more thick spit than usual.  There is a change in the color or thickness of the spit.  It is harder to breathe than usual.  Your breathing is faster than usual. Get help right away if:  You have shortness of breath while resting.  You have shortness of breath that stops you from:  Being able to talk.  Doing normal activities.  You chest hurts for longer than 5 minutes.  Your skin color is more blue than usual.  Your pulse oximeter shows that you have low oxygen for longer than 5 minutes. This information is not intended to replace advice given to you by your health care provider. Make sure you discuss any questions you have with your health care provider. Document Released: 07/06/2007 Document Revised: 06/25/2015 Document Reviewed: 09/13/2012 Elsevier Interactive Patient Education  2017 [employer].

## 2016-03-11 NOTE — Progress Notes (Signed)
SATURATION QUALIFICATIONS: (This note is used to comply with regulatory documentation for home oxygen)  Patient Saturations on Room Air at Rest = 84%  Patient Saturations on Room Air while Ambulating = 0%  Patient Saturations on 3 Liters of oxygen while Ambulating =0%  Please briefly explain why patient needs home oxygen:pts sats drop  To  84 % on r/a at rest.  Recovers to 89-92% on 3 l [place].

## 2016-03-12 NOTE — Discharge Summary (Signed)
[facility] NAME: Cristina Hale    MR#:  [id number]  DATE OF BIRTH:  [birth year]  DATE OF ADMISSION:  03/02/2016   ADMITTING PHYSICIAN: Wilhelmina Mcardle, MD  DATE OF DISCHARGE: 03/11/2016  2:30 PM  PRIMARY CARE PHYSICIAN: No PCP Per Patient   ADMISSION DIAGNOSIS:  Influenza A [J10.1] Acute respiratory failure with hypoxia ([place]) [J96.01] DISCHARGE DIAGNOSIS:  Active Problems:   Acute respiratory failure ([place])  SECONDARY DIAGNOSIS:   Past Medical History:  Diagnosis Date  . Cervical cancer ([place])   . COPD (chronic obstructive pulmonary disease) ([place])   . Pneumonia    HOSPITAL COURSE:  56 yo female with PMH significant for tobacco abuse and COPD Is admitted for acute hypoxic respiratory failure  * Ac hypoxic respi failure    Due to COPD exacerbation, severe emphysema.  * Acute COPD exacerbation, severe emphysema.    Influenza A was positive -  Finished Course of tamiflu. -  improving on oral steroids. - Echocardiogram.- EF normal, No shunt on bubble study, Elevated right sided pessure due to pulmonary issues. - Now on nasal canula 3 ltr oxygen  * Chronic smoking   Counseled to Quit smoking for 4 min.   on nicotine patch.  * leukocytosis   Secondary to steroids.  I have given her work excuse till 2/14. DISCHARGE CONDITIONS:  stable CONSULTS OBTAINED:   DRUG ALLERGIES:  No Known Allergies DISCHARGE MEDICATIONS:   Allergies as of 03/11/2016   No Known Allergies     Medication List    STOP taking these medications   levofloxacin 500 MG tablet Commonly known as:  LEVAQUIN   predniSONE 10 MG (21) Tbpk tablet Commonly known as:  STERAPRED UNI-PAK 21 TAB Replaced by:  predniSONE 10 MG tablet     TAKE these medications   albuterol 108 (90 Base) MCG/ACT inhaler Commonly known as:  PROVENTIL HFA;VENTOLIN HFA Inhale 1-2 puffs into the lungs every 4 (four) hours as needed for wheezing or shortness of  breath.   benzonatate 200 MG capsule Commonly known as:  TESSALON Take 1 capsule (200 mg total) by mouth 2 (two) times daily as needed for cough.   chlorpheniramine-HYDROcodone 10-8 MG/5ML Suer Commonly known as:  TUSSIONEX Take 5 mLs by mouth every 12 (twelve) hours.   Fluticasone-Salmeterol 250-50 MCG/DOSE Aepb Commonly known as:  ADVAIR Inhale 1 puff into the lungs 2 (two) times daily.   predniSONE 10 MG tablet Commonly known as:  DELTASONE Take 0.5 tablets (5 mg total) by mouth daily with breakfast. Prednisone 40 mg po daily, taper 5 mg daily until done (40-35-30-25-20-15-10-5) Replaces:  predniSONE 10 MG (21) Tbpk tablet   tiotropium 18 MCG inhalation capsule Commonly known as:  SPIRIVA HANDIHALER Place 1 capsule (18 mcg total) into inhaler and inhale daily.        DISCHARGE INSTRUCTIONS:   DIET:  Regular diet DISCHARGE CONDITION:  Good ACTIVITY:  Activity as tolerated OXYGEN:  Home Oxygen: Yes.    Oxygen Delivery: 3 liters/min via Patient connected to nasal cannula oxygen DISCHARGE LOCATION:  home - pt declined home health services  If you experience worsening of your admission symptoms, develop shortness of breath, life threatening emergency, suicidal or homicidal thoughts you must seek medical attention immediately by calling 911 or calling your MD immediately  if symptoms less severe.  You Must read complete instructions/literature along with all the possible adverse reactions/side effects for all the Medicines you take and that  have been prescribed to you. Take any new Medicines after you have completely understood and accpet all the possible adverse reactions/side effects.   Please note  You were cared for by a hospitalist during your hospital stay. If you have any questions about your discharge medications or the care you received while you were in the hospital after you are discharged, you can call the unit and asked to speak with the hospitalist on call if  the hospitalist that took care of you is not available. Once you are discharged, your primary care physician will handle any further medical issues. Please note that NO REFILLS for any discharge medications will be authorized once you are discharged, as it is imperative that you return to your primary care physician (or establish a relationship with a primary care physician if you do not have one) for your aftercare needs so that they can reassess your need for medications and monitor your lab values.    On the day of Discharge:  VITAL SIGNS:  Blood pressure (!) 109/53, pulse (!) 103, temperature 100 F (37.8 C), temperature source Oral, resp. rate 13, height 5\' 2"  (1.575 m), weight 40.7 kg (89 lb 12.8 oz), SpO2 (!) 86 %. PHYSICAL EXAMINATION:  GENERAL:  56 y.o.-year-old patient lying in the bed with no acute distress.  EYES: Pupils equal, round, reactive to light and accommodation. No scleral icterus. Extraocular muscles intact.  HEENT: Head atraumatic, normocephalic. Oropharynx and nasopharynx clear.  NECK:  Supple, no jugular venous distention. No thyroid enlargement, no tenderness.  LUNGS: Normal breath sounds bilaterally, no wheezing, rales,rhonchi or crepitation. No use of accessory muscles of respiration.  CARDIOVASCULAR: S1, S2 normal. No murmurs, rubs, or gallops.  ABDOMEN: Soft, non-tender, non-distended. Bowel sounds present. No organomegaly or mass.  EXTREMITIES: No pedal edema, cyanosis, or clubbing.  NEUROLOGIC: Cranial nerves II through XII are intact. Muscle strength 5/5 in all extremities. Sensation intact. Gait not checked.  PSYCHIATRIC: The patient is alert and oriented x 3.  SKIN: No obvious rash, lesion, or ulcer.  DATA REVIEW:   CBC  Recent Labs Lab 03/09/16 0359  WBC 27.7*  HGB 12.7  HCT 37.7  PLT 313    Chemistries   Recent Labs Lab 03/09/16 0359  NA 133*  K 3.9  CL 97*  CO2 30  GLUCOSE 112*  BUN 35*  CREATININE 0.78  CALCIUM 8.5*    Follow-up  Information    Wilhelmina Mcardle, MD. Go on 04/11/2016.   Specialty:  Pulmonary Disease Why:  @2 :45 PM Contact information: [address] [place] [address] [phone]        Coral Spikes, DO. Go on 03/11/2016.   Specialty:  Family Medicine Why:  @1 :30 PM  ---BRING INSURANCE CARD, MEDICATION LIST, AND ARRIVE 15 MIN EARLY Contact information: [address] [place] [address] [phone]           Management plans discussed with the patient, family and they are in agreement.  CODE STATUS:  Code Status History    Date Active Date Inactive Code Status Order ID Comments User Context   03/02/2016  7:41 PM 03/11/2016  5:39 PM Full Code [id number]  Holley Raring, NP ED      TOTAL TIME TAKING CARE OF THIS PATIENT: 45 minutes.    Max Sane M.D on 03/12/2016 at 6:34 PM  Between 7am to 6pm - Pager - [phone]  After 6pm go to [email or web address] - [facility]  [facility]  Office  [phone]  CC: Primary care physician; No PCP Per Patient   Note: This dictation was prepared with Dragon dictation along with smaller phrase technology. Any transcriptional errors that result from this process are unintentional.

## 2016-03-15 ENCOUNTER — Telehealth: Payer: Self-pay

## 2016-03-15 NOTE — Telephone Encounter (Signed)
Transition Care Management Follow-up Telephone Call   Date discharged? 03/11/16  How have you been since you were released from the hospital? Cough present with some chest tenderness from coughing.  Eating and drinking without issues. Oxygen in use.  No pain.  Resting without issues.   Do you understand why you were in the hospital? YES, PNEUMONIA and FLU.   Do you understand the discharge instructions? YES, increase activity as tolerated and eat more, keep oxygen set at 3L.   Where were you discharged to? Home.   Items Reviewed:  Medications reviewed: YES, taking cough syrup, prednisone and using inhalers as directed.  Allergies reviewed: YES, no known allergies.  Dietary changes reviewed: YES, Regular diet.  Referrals reviewed: YES, appointment scheduled with Pulmonary and PCP.   Functional Questionnaire:   Activities of Daily Living (ADLs):   She states they are independent in the following: Bathing, dressing, toileting, self feeding. States they require assistance with the following: Ambulates with walker when outside of the home. Meal prep.   Any transportation issues/concerns?: NO issues at this time.   Any patient concerns? No concerns at this time.   Confirmed importance and date/time of follow-up visits scheduled YES, appointment scheduled 03/17/16 at 1:30.  Provider Appointment booked with Dr. Lacinda Axon (PCP)  Confirmed with patient if condition begins to worsen call PCP or go to the ER.  Patient was given the office number and encouraged to call back with question or concerns.  : Verbalized understanding.

## 2016-03-15 NOTE — Telephone Encounter (Signed)
Pt d/c home 03/11/16. TCM complete.

## 2016-03-17 ENCOUNTER — Other Ambulatory Visit: Payer: Self-pay

## 2016-03-17 ENCOUNTER — Telehealth: Payer: Self-pay | Admitting: Radiology

## 2016-03-17 ENCOUNTER — Ambulatory Visit (INDEPENDENT_AMBULATORY_CARE_PROVIDER_SITE_OTHER): Payer: BC Managed Care – PPO | Admitting: Family Medicine

## 2016-03-17 ENCOUNTER — Ambulatory Visit: Admission: RE | Admit: 2016-03-17 | Payer: BC Managed Care – PPO | Source: Ambulatory Visit | Admitting: Family Medicine

## 2016-03-17 ENCOUNTER — Encounter: Payer: Self-pay | Admitting: Family Medicine

## 2016-03-17 VITALS — BP 96/65 | HR 95 | Temp 98.0°F | Ht 62.5 in | Wt 98.6 lb

## 2016-03-17 DIAGNOSIS — I700 Atherosclerosis of aorta: Secondary | ICD-10-CM | POA: Insufficient documentation

## 2016-03-17 DIAGNOSIS — Z1239 Encounter for other screening for malignant neoplasm of breast: Secondary | ICD-10-CM

## 2016-03-17 DIAGNOSIS — J439 Emphysema, unspecified: Secondary | ICD-10-CM | POA: Diagnosis not present

## 2016-03-17 DIAGNOSIS — D72829 Elevated white blood cell count, unspecified: Secondary | ICD-10-CM | POA: Diagnosis not present

## 2016-03-17 DIAGNOSIS — Z13220 Encounter for screening for lipoid disorders: Secondary | ICD-10-CM

## 2016-03-17 DIAGNOSIS — J449 Chronic obstructive pulmonary disease, unspecified: Secondary | ICD-10-CM | POA: Insufficient documentation

## 2016-03-17 DIAGNOSIS — Z0000 Encounter for general adult medical examination without abnormal findings: Secondary | ICD-10-CM | POA: Insufficient documentation

## 2016-03-17 DIAGNOSIS — J9692 Respiratory failure, unspecified with hypercapnia: Secondary | ICD-10-CM | POA: Diagnosis not present

## 2016-03-17 DIAGNOSIS — Z8541 Personal history of malignant neoplasm of cervix uteri: Secondary | ICD-10-CM | POA: Diagnosis not present

## 2016-03-17 DIAGNOSIS — J9691 Respiratory failure, unspecified with hypoxia: Secondary | ICD-10-CM | POA: Diagnosis not present

## 2016-03-17 DIAGNOSIS — Z1159 Encounter for screening for other viral diseases: Secondary | ICD-10-CM

## 2016-03-17 DIAGNOSIS — Z131 Encounter for screening for diabetes mellitus: Secondary | ICD-10-CM

## 2016-03-17 DIAGNOSIS — Z1211 Encounter for screening for malignant neoplasm of colon: Secondary | ICD-10-CM

## 2016-03-17 DIAGNOSIS — Z87891 Personal history of nicotine dependence: Secondary | ICD-10-CM | POA: Insufficient documentation

## 2016-03-17 LAB — LIPID PANEL
Cholesterol: 132 mg/dL (ref 0–200)
HDL: 43.1 mg/dL (ref >39.00)
LDL Cholesterol: 74 mg/dL (ref 0–99)
NonHDL: 89.38
Total CHOL/HDL Ratio: 3
Triglycerides: 76 mg/dL (ref 0.0–149.0)
VLDL: 15.2 mg/dL (ref 0.0–40.0)

## 2016-03-17 LAB — CBC
HCT: 36.7 % (ref 36.0–46.0)
Hemoglobin: 11.9 g/dL — ABNORMAL LOW (ref 12.0–15.0)
MCHC: 32.4 g/dL (ref 30.0–36.0)
MCV: 87.3 fl (ref 78.0–100.0)
Platelets: 442 10*3/uL — ABNORMAL HIGH (ref 150.0–400.0)
RBC: 4.21 Mil/uL (ref 3.87–5.11)
RDW: 14.6 % (ref 11.5–15.5)
WBC: 23.2 10*3/uL (ref 4.0–10.5)

## 2016-03-17 LAB — COMPREHENSIVE METABOLIC PANEL
ALBUMIN: 2.8 g/dL — AB (ref 3.5–5.2)
ALT: 54 U/L — AB (ref 0–35)
AST: 26 U/L (ref 0–37)
Alkaline Phosphatase: 64 U/L (ref 39–117)
BILIRUBIN TOTAL: 0.5 mg/dL (ref 0.2–1.2)
BUN: 13 mg/dL (ref 6–23)
CO2: 34 mEq/L — ABNORMAL HIGH (ref 19–32)
CREATININE: 0.53 mg/dL (ref 0.40–1.20)
Calcium: 8.5 mg/dL (ref 8.4–10.5)
Chloride: 89 mEq/L — ABNORMAL LOW (ref 96–112)
GFR: 125.94 mL/min (ref >60.00)
Glucose, Bld: 121 mg/dL — ABNORMAL HIGH (ref 70–99)
Potassium: 4.6 mEq/L (ref 3.5–5.1)
SODIUM: 126 meq/L — AB (ref 135–145)
TOTAL PROTEIN: 6.1 g/dL (ref 6.0–8.3)

## 2016-03-17 NOTE — Assessment & Plan Note (Signed)
Noted on xray. Has quit smoking. Advised daily aspirin therapy.

## 2016-03-17 NOTE — Assessment & Plan Note (Addendum)
Currently on oxygen therapy. Not sure if she will be able to come off O2 given what appears to be underlying severe COPD. She has follow-up with pulmonology later this month. Her hospital course was reviewed and is summarized as in history of present illness. Her medications were reviewed and reconciled today.  ** Addendum - WBC count return elevated in addition to hyponatremia. Chest xray obtained and revealed multifocal pneumonia. Patient recommended from our clinic to the hospital service.

## 2016-03-17 NOTE — Patient Instructions (Signed)
Continue medications.  Please follow-up with Dr. Alva Garnet.  Follow-up with me in one month. We will arrange a mammogram. You need a Pap smear later this year.  Take care  Dr. Lacinda Axon

## 2016-03-17 NOTE — Progress Notes (Signed)
Pt called and told to go to [facility] to have X-Ray.

## 2016-03-17 NOTE — Telephone Encounter (Signed)
Critical Lab called for pt  WBC 23.2

## 2016-03-17 NOTE — Progress Notes (Signed)
Pre visit review using our clinic review tool, if applicable. No additional management support is needed unless otherwise documented below in the visit note. 

## 2016-03-17 NOTE — Assessment & Plan Note (Signed)
Arranging mammogram. Patient in agreement for cologuard. Labs today including hep C. Patient declined HIV screening. Patient has history of cervical cancer and needs a Pap smear. She is comfortable with me performing and will get later this year at follow-up appointment.

## 2016-03-17 NOTE — Assessment & Plan Note (Addendum)
Patient has what appears to be severe COPD. She endorses compliance with Advair and Spiriva. Currently on O2. Endorses weakness. Sats stable at home 90-92.

## 2016-03-17 NOTE — Progress Notes (Addendum)
Subjective:  Patient ID: Cristina Hale, female    DOB: 1960-02-08  Age: 56 y.o. MRN: [id number]  CC: Establish care, Hospital follow up.  HPI Cristina Hale is a 56 y.o. female presents to the clinic today for hospital follow-up and to establish care.  Patient was recently admitted from 1/31 to 2/9. Hospital course summarized as follows:  Prior to admission, patient had been diagnosed with CAP. She fail to improve and subsequently presented to the hospital with shortness of breath. She was found to be hypoxic and was positive for flu. She was treated with Tamiflu and was also treated for COPD exacerbation given known tobacco abuse and suspected COPD. She required BiPAP and was subsequently weaned down to nasal cannula oxygen. She was discharged home on 3 L of nasal cannula oxygen. She was also discharged on Advair and Spiriva.  Patient resents today to establish care for hospital follow-up. She states that she has been compliant with her oxygen as well as her Advair and Spiriva. She reports that she has a few days left on her prednisone. She endorses weakness and shortness of breath. She has had some recent improvement but still feels quite weak and short of breath particularly in the morning. No associated fever. Additionally, patient reports that prior to admission she had unintentional weight loss. She reports an increase in her appetite recently. She has gained weight since her hospitalization.  PMH, Surgical Hx, Family Hx, Social History reviewed and updated as below.  Past Medical History:  Diagnosis Date  . Cervical cancer ([place])   . COPD (chronic obstructive pulmonary disease) ([place])   . Pneumonia    Past Surgical History:  Procedure Laterality Date  . NO PAST SURGERIES     Family History  Problem Relation Age of Onset  . Alcohol abuse Mother   . Heart disease Mother   . Alcohol abuse Father   . Heart disease Maternal Grandmother    Social History  Substance Use Topics    . Smoking status: Former Smoker    Packs/day: 0.50    Quit date: 02/25/2016  . Smokeless tobacco: Never Used     Comment: Started at age of 32  . Alcohol use No   Review of Systems  Constitutional: Positive for unexpected weight change.  Respiratory: Positive for shortness of breath.   Neurological: Positive for weakness.  All other systems reviewed and are negative.  Objective:   Today's Vitals: BP 96/65   Pulse 95   Temp 98 F (36.7 C) (Oral)   Ht 5' 2.5" (1.588 m)   Wt 98 lb 9.6 oz (44.7 kg)   SpO2 91%   BMI 17.75 kg/m   Physical Exam  Constitutional: She is oriented to person, place, and time.  Thin female in no acute distress.  HENT:  Head: Normocephalic and atraumatic.  Mouth/Throat: Oropharynx is clear and moist.  Eyes: Conjunctivae are normal.  Neck: Neck supple.  Cardiovascular: Normal rate and regular rhythm.   Pulmonary/Chest: Effort normal.  Decreased air movement in all lung fields. No adventitious sounds appreciated.  Abdominal: Soft. She exhibits no distension. There is no tenderness. There is no rebound.  Musculoskeletal: Normal range of motion.  Neurological: She is alert and oriented to person, place, and time.  Skin: No rash noted.  Psychiatric: She has a normal mood and affect.  Vitals reviewed.  Assessment & Plan:   Problem List Items Addressed This Visit    Respiratory failure ([place]) - Primary  Currently on oxygen therapy. Not sure if she will be able to come off O2 given what appears to be underlying severe COPD. She has follow-up with pulmonology later this month. Her hospital course was reviewed and is summarized as in history of present illness. Her medications were reviewed and reconciled today.  ** Addendum - WBC count return elevated in addition to hyponatremia. Chest xray obtained and revealed multifocal pneumonia. Patient recommended from our clinic to the hospital service.      Relevant Orders   Comp Met (CMET) (Completed)    Preventative health care    Arranging mammogram. Patient in agreement for cologuard. Labs today including hep C. Patient declined HIV screening. Patient has history of cervical cancer and needs a Pap smear. She is comfortable with me performing and will get later this year at follow-up appointment.       History of cervical cancer   COPD (chronic obstructive pulmonary disease) ([place])    Patient has what appears to be severe COPD. She endorses compliance with Advair and Spiriva. Currently on O2. Endorses weakness. Sats stable at home 90-92.      Aortic atherosclerosis ([place])    Noted on xray. Has quit smoking. Advised daily aspirin therapy.       Other Visit Diagnoses    Screening for breast cancer       Relevant Orders   MM DIGITAL SCREENING BILATERAL   Leukocytosis, unspecified type       Relevant Orders   CBC (Completed)   Screening, lipid       Relevant Orders   Lipid panel (Completed)   Screening for diabetes mellitus       Relevant Orders   Hemoglobin A1c (Completed)   Need for hepatitis C screening test       Relevant Orders   Hepatitis C Antibody (Completed)   Colon cancer screening       Relevant Orders   Cologuard      Outpatient Encounter Prescriptions as of 03/17/2016  Medication Sig  . albuterol (PROVENTIL HFA;VENTOLIN HFA) 108 (90 Base) MCG/ACT inhaler Inhale 1-2 puffs into the lungs every 4 (four) hours as needed for wheezing or shortness of breath.  . Fluticasone-Salmeterol (ADVAIR) 250-50 MCG/DOSE AEPB Inhale 1 puff into the lungs 2 (two) times daily.  Marland Kitchen tiotropium (SPIRIVA HANDIHALER) 18 MCG inhalation capsule Place 1 capsule (18 mcg total) into inhaler and inhale daily.  . [DISCONTINUED] benzonatate (TESSALON) 200 MG capsule Take 1 capsule (200 mg total) by mouth 2 (two) times daily as needed for cough.  . [DISCONTINUED] chlorpheniramine-HYDROcodone (TUSSIONEX) 10-8 MG/5ML SUER Take 5 mLs by mouth every 12 (twelve) hours.  . [DISCONTINUED] predniSONE  (DELTASONE) 10 MG tablet Take 0.5 tablets (5 mg total) by mouth daily with breakfast. Prednisone 40 mg po daily, taper 5 mg daily until done (40-35-30-25-20-15-10-5)   No facility-administered encounter medications on file as of 03/17/2016.     Follow-up: Return in about 1 month (around 04/14/2016).  [place]

## 2016-03-18 ENCOUNTER — Ambulatory Visit (INDEPENDENT_AMBULATORY_CARE_PROVIDER_SITE_OTHER): Payer: BC Managed Care – PPO

## 2016-03-18 ENCOUNTER — Inpatient Hospital Stay
Admission: AD | Admit: 2016-03-18 | Discharge: 2016-03-21 | DRG: 193 | Disposition: A | Discharge status: Home / Self Care | Payer: BC Managed Care – PPO | Source: Ambulatory Visit | Attending: Internal Medicine | Admitting: Internal Medicine

## 2016-03-18 ENCOUNTER — Telehealth: Payer: Self-pay | Admitting: Family Medicine

## 2016-03-18 ENCOUNTER — Inpatient Hospital Stay: Payer: BC Managed Care – PPO

## 2016-03-18 DIAGNOSIS — D72829 Elevated white blood cell count, unspecified: Secondary | ICD-10-CM | POA: Diagnosis present

## 2016-03-18 DIAGNOSIS — J189 Pneumonia, unspecified organism: Secondary | ICD-10-CM | POA: Diagnosis not present

## 2016-03-18 DIAGNOSIS — Z8249 Family history of ischemic heart disease and other diseases of the circulatory system: Secondary | ICD-10-CM | POA: Diagnosis not present

## 2016-03-18 DIAGNOSIS — J440 Chronic obstructive pulmonary disease with acute lower respiratory infection: Secondary | ICD-10-CM | POA: Diagnosis present

## 2016-03-18 DIAGNOSIS — J9621 Acute and chronic respiratory failure with hypoxia: Secondary | ICD-10-CM | POA: Diagnosis present

## 2016-03-18 DIAGNOSIS — T380X5A Adverse effect of glucocorticoids and synthetic analogues, initial encounter: Secondary | ICD-10-CM | POA: Diagnosis present

## 2016-03-18 DIAGNOSIS — Z8541 Personal history of malignant neoplasm of cervix uteri: Secondary | ICD-10-CM | POA: Diagnosis not present

## 2016-03-18 DIAGNOSIS — E871 Hypo-osmolality and hyponatremia: Secondary | ICD-10-CM | POA: Diagnosis present

## 2016-03-18 DIAGNOSIS — Z87891 Personal history of nicotine dependence: Secondary | ICD-10-CM

## 2016-03-18 DIAGNOSIS — Y95 Nosocomial condition: Secondary | ICD-10-CM | POA: Diagnosis present

## 2016-03-18 DIAGNOSIS — Z811 Family history of alcohol abuse and dependence: Secondary | ICD-10-CM | POA: Diagnosis not present

## 2016-03-18 DIAGNOSIS — D649 Anemia, unspecified: Secondary | ICD-10-CM | POA: Diagnosis present

## 2016-03-18 DIAGNOSIS — R0602 Shortness of breath: Secondary | ICD-10-CM | POA: Diagnosis present

## 2016-03-18 DIAGNOSIS — J449 Chronic obstructive pulmonary disease, unspecified: Secondary | ICD-10-CM

## 2016-03-18 LAB — CBC
HEMATOCRIT: 31.2 % — AB (ref 35.0–47.0)
HEMOGLOBIN: 10.3 g/dL — AB (ref 12.0–16.0)
MCH: 28.4 pg (ref 26.0–34.0)
MCHC: 33.1 g/dL (ref 32.0–36.0)
MCV: 85.9 fL (ref 80.0–100.0)
Platelets: 372 10*3/uL (ref 150–440)
RBC: 3.64 MIL/uL — ABNORMAL LOW (ref 3.80–5.20)
RDW: 14.7 % — AB (ref 11.5–14.5)
WBC: 18.8 10*3/uL — AB (ref 3.6–11.0)

## 2016-03-18 LAB — PROCALCITONIN: Procalcitonin: <0.1 ng/mL

## 2016-03-18 LAB — HEMOGLOBIN A1C
Hgb A1c MFr Bld: 5.7 % — ABNORMAL HIGH (ref <5.7)
Mean Plasma Glucose: 117 mg/dL

## 2016-03-18 LAB — CREATININE, SERUM: Creatinine, Ser: 0.3 mg/dL — ABNORMAL LOW (ref 0.44–1.00)

## 2016-03-18 LAB — HEPATITIS C ANTIBODY: HCV AB: NEGATIVE

## 2016-03-18 MED ORDER — BENZONATATE 100 MG PO CAPS
100.0000 mg | ORAL_CAPSULE | Freq: Two times a day (BID) | ORAL | Status: DC
  Administered 2016-03-18 – 2016-03-21 (×6): 100 mg via ORAL
  Filled 2016-03-18 (×6): qty 1

## 2016-03-18 MED ORDER — VANCOMYCIN HCL IN DEXTROSE 1-5 GM/200ML-% IV SOLN
1000.0000 mg | Freq: Once | INTRAVENOUS | Status: AC
  Administered 2016-03-18: 1000 mg via INTRAVENOUS
  Filled 2016-03-18: qty 200

## 2016-03-18 MED ORDER — TIOTROPIUM BROMIDE MONOHYDRATE 18 MCG IN CAPS
18.0000 ug | ORAL_CAPSULE | Freq: Every day | RESPIRATORY_TRACT | Status: DC
  Administered 2016-03-19 – 2016-03-21 (×3): 18 ug via RESPIRATORY_TRACT
  Filled 2016-03-18: qty 5

## 2016-03-18 MED ORDER — HYDROCOD POLST-CPM POLST ER 10-8 MG/5ML PO SUER
5.0000 mL | Freq: Two times a day (BID) | ORAL | Status: DC
  Administered 2016-03-18 – 2016-03-21 (×6): 5 mL via ORAL
  Filled 2016-03-18 (×6): qty 5

## 2016-03-18 MED ORDER — PREDNISOLONE 5 MG PO TABS: 10.0000 mg | ORAL_TABLET | Freq: Every day | ORAL | Status: DC

## 2016-03-18 MED ORDER — DOCUSATE SODIUM 100 MG PO CAPS
100.0000 mg | ORAL_CAPSULE | Freq: Two times a day (BID) | ORAL | Status: DC | PRN
  Filled 2016-03-18: qty 1

## 2016-03-18 MED ORDER — PIPERACILLIN-TAZOBACTAM 3.375 G IVPB
3.3750 g | Freq: Three times a day (TID) | INTRAVENOUS | Status: DC
  Administered 2016-03-18 – 2016-03-21 (×10): 3.375 g via INTRAVENOUS
  Filled 2016-03-18 (×10): qty 50

## 2016-03-18 MED ORDER — PREDNISONE 10 MG PO TABS
10.0000 mg | ORAL_TABLET | Freq: Every day | ORAL | Status: DC
  Administered 2016-03-19: 09:00:00 10 mg via ORAL
  Filled 2016-03-18 (×2): qty 1

## 2016-03-18 MED ORDER — HEPARIN SODIUM (PORCINE) 5000 UNIT/ML IJ SOLN
5000.0000 [IU] | Freq: Three times a day (TID) | INTRAMUSCULAR | Status: DC
  Administered 2016-03-18 – 2016-03-20 (×5): 5000 [IU] via SUBCUTANEOUS
  Filled 2016-03-18 (×6): qty 1

## 2016-03-18 MED ORDER — VANCOMYCIN HCL IN DEXTROSE 750-5 MG/150ML-% IV SOLN
750.0000 mg | INTRAVENOUS | Status: DC
  Administered 2016-03-19 – 2016-03-20 (×3): 750 mg via INTRAVENOUS
  Filled 2016-03-18 (×5): qty 150

## 2016-03-18 MED ORDER — SODIUM CHLORIDE 0.9 % IV SOLN
INTRAVENOUS | Status: DC
  Administered 2016-03-18 – 2016-03-21 (×6): via INTRAVENOUS

## 2016-03-18 MED ORDER — IPRATROPIUM-ALBUTEROL 0.5-2.5 (3) MG/3ML IN SOLN
3.0000 mL | Freq: Four times a day (QID) | RESPIRATORY_TRACT | Status: DC
  Administered 2016-03-18 – 2016-03-19 (×6): 3 mL via RESPIRATORY_TRACT
  Filled 2016-03-18 (×6): qty 3

## 2016-03-18 MED ORDER — SODIUM CHLORIDE 0.9 % IV BOLUS (SEPSIS)
1000.0000 mL | Freq: Once | INTRAVENOUS | Status: AC
  Administered 2016-03-18: 1000 mL via INTRAVENOUS

## 2016-03-18 NOTE — Progress Notes (Signed)
Pharmacy Antibiotic Note  Cristina Hale is a 56 y.o. female admitted on 03/18/2016 with pneumonia/?HCAP.  Pharmacy has been consulted for Zosyn dosing.  recent admission for Influenza 03/02/16- 03/11/16.  Plan: Zosyn 3.375g IV q8h (4 hour infusion).     Height: 5' 2.5" (158.8 cm) Weight: 94 lb 8 oz (42.9 kg) IBW/kg (Calculated) : 51.25  Temp (24hrs), Avg:98.1 F (36.7 C), Min:98 F (36.7 C), Max:98.2 F (36.8 C)   Recent Labs Lab 03/17/16 1407  WBC 23.2 Repeated and verified X2.*  CREATININE 0.53    Estimated Creatinine Clearance: 52.5 mL/min (by C-G formula based on SCr of 0.53 mg/dL).    No Known Allergies  Antimicrobials this admission: Zosyn 2/16 >>       >>    Dose adjustments this admission:    Microbiology results:   BCx:     UCx:      Sputum:    1/31 previous admissione MRSA PCR: negative 2/16 CXR= PNA  Thank you for allowing pharmacy to be a part of this patient's care.  Gray Doering A 03/18/2016 11:52 AM

## 2016-03-18 NOTE — Telephone Encounter (Signed)
PCP aware pt coming in for a chesst X-ray.

## 2016-03-18 NOTE — Telephone Encounter (Signed)
Spoke with patient last night regarding her laboratory studies. I sent her for chest x-ray. [facility] did not do the chest x-ray as the order was not cosigned (I gave a verbal to my staff). We discussed going to the hospital versus seeing me today, 2/16. Patient elected for the latter. Chest x-ray done today and revealed multifocal pneumonia. Given her laboratory studies, chest x-ray findings, and recent admission, I recommend that she go back to the hospital. I discussed with the hospitalist. She will be directly admitted for treatment for HCAP.

## 2016-03-18 NOTE — H&P (Signed)
[facility] [place] NAME: Cristina Hale    MR#:  [id number]  DATE OF BIRTH:  17-Oct-1960  DATE OF ADMISSION:  03/18/2016  PRIMARY CARE PHYSICIAN: Coral Spikes, DO   REQUESTING/REFERRING PHYSICIAN: Dr.Cooks  CHIEF COMPLAINT:  No chief complaint on file.   HISTORY OF PRESENT ILLNESS: Cristina Hale  is a 56 y.o. female with a known history of COPD, recent admission for Influenza and had a long hospital stay, sent home with supplemental oxygen and tapering steroids. Continue to have intermittent cough- went for routine follow up with PMD- he did Xray chest- which showed multifocal PNA and sent her for direct admission. Pt denies sputum production or fever.  PAST MEDICAL HISTORY:   Past Medical History:  Diagnosis Date  . Cervical cancer ([place])   . COPD (chronic obstructive pulmonary disease) ([place])   . Pneumonia     PAST SURGICAL HISTORY: Past Surgical History:  Procedure Laterality Date  . NO PAST SURGERIES      SOCIAL HISTORY:  Social History  Substance Use Topics  . Smoking status: Former Smoker    Packs/day: 0.50    Quit date: 02/25/2016  . Smokeless tobacco: Never Used     Comment: Started at age of 1  . Alcohol use No    FAMILY HISTORY:  Family History  Problem Relation Age of Onset  . Alcohol abuse Mother   . Heart disease Mother   . Alcohol abuse Father   . Heart disease Maternal Grandmother     DRUG ALLERGIES: No Known Allergies  REVIEW OF SYSTEMS:   CONSTITUTIONAL: No fever, fatigue or weakness.  EYES: No blurred or double vision.  EARS, NOSE, AND THROAT: No tinnitus or ear pain.  RESPIRATORY: positive for cough, no shortness of breath, wheezing or hemoptysis.  CARDIOVASCULAR: No chest pain, orthopnea, edema.  GASTROINTESTINAL: No nausea, vomiting, diarrhea or abdominal pain.  GENITOURINARY: No dysuria, hematuria.  ENDOCRINE: No polyuria, nocturia,  HEMATOLOGY: No anemia, easy bruising or bleeding SKIN: No  rash or lesion. MUSCULOSKELETAL: No joint pain or arthritis.   NEUROLOGIC: No tingling, numbness, weakness.  PSYCHIATRY: No anxiety or depression.   MEDICATIONS AT HOME:  Prior to Admission medications   Medication Sig Start Date End Date Taking? Authorizing Provider  benzonatate (TESSALON) 100 MG capsule Take 100 mg by mouth 2 (two) times daily.   Yes Historical Provider, MD  chlorpheniramine-HYDROcodone (TUSSIONEX) 10-8 MG/5ML SUER Take 5 mLs by mouth 2 (two) times daily.   Yes Historical Provider, MD  albuterol (PROVENTIL HFA;VENTOLIN HFA) 108 (90 Base) MCG/ACT inhaler Inhale 1-2 puffs into the lungs every 4 (four) hours as needed for wheezing or shortness of breath.    Historical Provider, MD  Fluticasone-Salmeterol (ADVAIR) 250-50 MCG/DOSE AEPB Inhale 1 puff into the lungs 2 (two) times daily. 03/11/16   Max Sane, MD  tiotropium (SPIRIVA HANDIHALER) 18 MCG inhalation capsule Place 1 capsule (18 mcg total) into inhaler and inhale daily. 03/11/16   Max Sane, MD      PHYSICAL EXAMINATION:   VITAL SIGNS: Blood pressure (!) 95/53, pulse 90, temperature 98.2 F (36.8 C), temperature source Oral, resp. rate 18, height 5' 2.5" (1.588 m), weight 42.9 kg (94 lb 8 oz), SpO2 94 %.  GENERAL:  56 y.o.-year-old patient lying in the bed with no acute distress.  EYES: Pupils equal, round, reactive to light and accommodation. No scleral icterus. Extraocular muscles intact.  HEENT: Head atraumatic, normocephalic. Oropharynx and nasopharynx clear.  NECK:  Supple, no jugular venous distention. No thyroid enlargement, no tenderness.  LUNGS: Normal breath sounds bilaterally, no wheezing, some crepitation. No use of accessory muscles of respiration.  CARDIOVASCULAR: S1, S2 normal. No murmurs, rubs, or gallops.  ABDOMEN: Soft, nontender, nondistended. Bowel sounds present. No organomegaly or mass.  EXTREMITIES: No pedal edema, cyanosis, or clubbing.  NEUROLOGIC: Cranial nerves II through XII are intact.  Muscle strength 5/5 in all extremities. Sensation intact. Gait not checked.  PSYCHIATRIC: The patient is alert and oriented x 3.  SKIN: No obvious rash, lesion, or ulcer.   LABORATORY PANEL:   CBC  Recent Labs Lab 03/17/16 1407  WBC 23.2 Repeated and verified X2.*  HGB 11.9*  HCT 36.7  PLT 442.0*  MCV 87.3  MCHC 32.4  RDW 14.6   ------------------------------------------------------------------------------------------------------------------  Chemistries   Recent Labs Lab 03/17/16 1407  NA 126*  K 4.6  CL 89*  CO2 34*  GLUCOSE 121*  BUN 13  CREATININE 0.53  CALCIUM 8.5  AST 26  ALT 54*  ALKPHOS 64  BILITOT 0.5   ------------------------------------------------------------------------------------------------------------------ estimated creatinine clearance is 52.5 mL/min (by C-G formula based on SCr of 0.53 mg/dL). ------------------------------------------------------------------------------------------------------------------ No results for input(s): TSH, T4TOTAL, T3FREE, THYROIDAB in the last 72 hours.  Invalid input(s): FREET3   Coagulation profile No results for input(s): INR, PROTIME in the last 168 hours. ------------------------------------------------------------------------------------------------------------------- No results for input(s): DDIMER in the last 72 hours. -------------------------------------------------------------------------------------------------------------------  Cardiac Enzymes No results for input(s): CKMB, TROPONINI, MYOGLOBIN in the last 168 hours.  Invalid input(s): CK ------------------------------------------------------------------------------------------------------------------ Invalid input(s): POCBNP  ---------------------------------------------------------------------------------------------------------------  Urinalysis No results found for: COLORURINE, APPEARANCEUR, LABSPEC, PHURINE, GLUCOSEU, HGBUR,  BILIRUBINUR, KETONESUR, PROTEINUR, UROBILINOGEN, NITRITE, LEUKOCYTESUR   RADIOLOGY: Dg Chest 2 View  Result Date: 03/18/2016 CLINICAL DATA:  Elevated white blood cell count. Shortness of breath EXAM: CHEST  2 VIEW COMPARISON:  03/05/2016 FINDINGS: Patchy bilateral airspace disease compatible with multifocal pneumonia. Underlying COPD. Heart is normal size. Probable small left effusion. No acute bony abnormality. IMPRESSION: COPD. Patchy bilateral airspace opacities compatible with multifocal pneumonia. Small left effusion. Electronically Signed   By: Rolm Baptise M.D.   On: 03/18/2016 08:53    EKG: No orders found for this or any previous visit.  IMPRESSION AND PLAN:  * Bilateral pneumonia   HCAP- recent admission and long stay in hospital for Influenza    MRSA screen was negative recently.   Give IV zosyn.   Try to get sputum cx.   Pulmonary consult.   Get CT chest  * Hyponatremia   IV NS and follow.  * COPD, Ch respi failure   No active wheezing.   She is on taper steroid from last admission, finish that tomorrow.   COnt spiriva.   Give nebs.  * elevated WBCs   Due to pneumonia and steroids use.   Monitor.  All the records are reviewed and case discussed with ED provider. Management plans discussed with the patient, family and they are in agreement.  CODE STATUS: Full. Code Status History    Date Active Date Inactive Code Status Order ID Comments User Context   03/02/2016  7:41 PM 03/11/2016  5:39 PM Full Code [id number]  Holley Raring, NP ED     Her daughter was present in room during my visit.  TOTAL TIME TAKING CARE OF THIS PATIENT: 45 minutes.    Vaughan Basta M.D on 03/18/2016   Between 7am to 6pm - Pager - [phone]  After 6pm go to [email or web address] - password  [email or web address]  [facility]  Office  [phone]  CC: Primary care physician; Coral Spikes, DO   Note: This dictation was prepared with Dragon dictation along with  smaller phrase technology. Any transcriptional errors that result from this process are unintentional.

## 2016-03-18 NOTE — Consult Note (Signed)
Name: Cristina Hale MRN: [id number] DOB: Feb 07, 1960    ADMISSION DATE:  03/18/2016 CONSULTATION DATE:  03/18/2016  REFERRING MD : Dr. Anselm Jungling  CHIEF COMPLAINT: Intermittent Cough  BRIEF PATIENT DESCRIPTION: Pt admitted with acute respiratory failure secondary to HCAP PCCM consulted 02/16 for additional management   SIGNIFICANT EVENTS  02/16-Pt direct admit from PCP due to acute respiratory failure due to multifocal pneumonia PCCM consulted for additional management   STUDIES:  CT Chest 02/16>>  HISTORY OF PRESENT ILLNESS:   This is a 56 yo female with a PMH of Pneumonia, COPD, Former Smoker, and Cervical Cancer.  She presented to [facility] on 02/16 as a direct admit from her PCP office following a routine follow-up due to cxr revealing multifocal pneumonia.  She was recently discharged from the hospital on 02/9 following a long hospital course due to acute on chronic hypoxic respiratory failure secondary to Influenza A and AECOPD.  She was discharged home with supplemental O2, steroid tapers, and completed a tamiflu regimen while hospitalized, however despite treatment she continued to have an intermittent cough.  PCCM consulted 02/16 for additional management of acute on chronic hypoxic respiratory failure secondary to HCAP.    PAST MEDICAL HISTORY :   has a past medical history of Cervical cancer ([place]); COPD (chronic obstructive pulmonary disease) ([place]); and Pneumonia.  has a past surgical history that includes No past surgeries. Prior to Admission medications   Medication Sig Start Date End Date Taking? Authorizing Provider  benzonatate (TESSALON) 100 MG capsule Take 100 mg by mouth 2 (two) times daily.   Yes Historical Provider, MD  chlorpheniramine-HYDROcodone (TUSSIONEX) 10-8 MG/5ML SUER Take 5 mLs by mouth 2 (two) times daily.   Yes Historical Provider, MD  albuterol (PROVENTIL HFA;VENTOLIN HFA) 108 (90 Base) MCG/ACT inhaler Inhale 1-2 puffs into the lungs every 4 (four) hours as  needed for wheezing or shortness of breath.    Historical Provider, MD  Fluticasone-Salmeterol (ADVAIR) 250-50 MCG/DOSE AEPB Inhale 1 puff into the lungs 2 (two) times daily. 03/11/16   Max Sane, MD  tiotropium (SPIRIVA HANDIHALER) 18 MCG inhalation capsule Place 1 capsule (18 mcg total) into inhaler and inhale daily. 03/11/16   Max Sane, MD   No Known Allergies  FAMILY HISTORY:  family history includes Alcohol abuse in her father and mother; Heart disease in her maternal grandmother and mother. SOCIAL HISTORY:  reports that she quit smoking about 3 weeks ago. She smoked 0.50 packs per day. She has never used smokeless tobacco. She reports that she does not drink alcohol or use drugs.  REVIEW OF SYSTEMS:   Constitutional: Negative for fever, chills, weight loss, malaise/fatigue and diaphoresis.  HENT: Negative for hearing loss, ear pain, nosebleeds, congestion, sore throat, neck pain, tinnitus and ear discharge.   Eyes: Negative for blurred vision, double vision, photophobia, pain, discharge and redness.  Respiratory: Negative for cough, hemoptysis, sputum production, shortness of breath, wheezing and stridor.   Cardiovascular: Negative for chest pain, palpitations, orthopnea, claudication, leg swelling and PND.  Gastrointestinal: Negative for heartburn, nausea, vomiting, abdominal pain, diarrhea, constipation, blood in stool and melena.  Genitourinary: Negative for dysuria, urgency, frequency, hematuria and flank pain.  Musculoskeletal: Negative for myalgias, back pain, joint pain and falls.  Skin: Negative for itching and rash.  Neurological: Negative for dizziness, tingling, tremors, sensory change, speech change, focal weakness, seizures, loss of consciousness, weakness and headaches.  Endo/Heme/Allergies: Negative for environmental allergies and polydipsia. Does not bruise/bleed easily.  SUBJECTIVE:   VITAL SIGNS:  Temp:  [98 F (36.7 C)-98.2 F (36.8 C)] 98.2 F (36.8 C) (02/16  1020) Pulse Rate:  [90-95] 90 (02/16 1020) Resp:  [18] 18 (02/16 1020) BP: (95-96)/(53-65) 95/53 (02/16 1020) SpO2:  [91 %-94 %] 94 % (02/16 1020) Weight:  [42.9 kg (94 lb 8 oz)-44.7 kg (98 lb 9.6 oz)] 42.9 kg (94 lb 8 oz) (02/16 1038)  PHYSICAL EXAMINATION: General:  Patient is awake alert oriented no acute distress Neuro:  Cranial nerves grossly intact, no focal deficits appreciated HEENT:  No oral lesions noted, patient wearing supplemental oxygen, trachea is midline, no thyromegaly noted, no stridor appreciated or accessory muscle utilization Cardiovascular:  Regular rate and rhythm, diminished heart sounds Lungs:  Prolonged expiratory phase, distant breath sounds however no evidence of active wheezing at this time Abdomen:  Positive bowel sounds, soft nontender, no hepatomegaly Musculoskeletal:  No clubbing cyanosis or edema noted Skin:  No cutaneous rashes or lesions appreciated   Recent Labs Lab 03/17/16 1407  NA 126*  K 4.6  CL 89*  CO2 34*  BUN 13  CREATININE 0.53  GLUCOSE 121*    Recent Labs Lab 03/17/16 1407  HGB 11.9*  HCT 36.7  WBC 23.2 Repeated and verified X2.*  PLT 442.0*   Dg Chest 2 View  Result Date: 03/18/2016 CLINICAL DATA:  Elevated white blood cell count. Shortness of breath EXAM: CHEST  2 VIEW COMPARISON:  03/05/2016 FINDINGS: Patchy bilateral airspace disease compatible with multifocal pneumonia. Underlying COPD. Heart is normal size. Probable small left effusion. No acute bony abnormality. IMPRESSION: COPD. Patchy bilateral airspace opacities compatible with multifocal pneumonia. Small left effusion. Electronically Signed   By: Rolm Baptise M.D.   On: 03/18/2016 08:53    ASSESSMENT / PLAN: Acute on chronic hypoxic respiratory failure secondary to HCAP  Hx: Former smoker and COPD. Chest x-ray reveals multiple parenchymal opacities, some appear to have air-fluid level versus cavitary disease versus confluence of shadows. Agree with CT scanning and  broad-spectrum antibiotic coverage. Based on CT scan finding may require additional diagnostic studies to include echocardiogram/bronchoscopy patient is on Zosyn would empirically add vancomycin P: Supplemental O2 to maintain O2 sats 88% to 92% CT Chest results pending  Trend PCT's Trend WBC and monitor fever curve Follow cultures  Repeat CXR in am

## 2016-03-18 NOTE — Progress Notes (Signed)
Pharmacy Antibiotic Note  Cristina Hale is a 56 y.o. female admitted on 03/18/2016 with pneumonia/?HCAP.  Pharmacy has been consulted for Zosyn and Vancomycin dosing.  Recent admission for Influenza + and AECOPD 03/02/16- 03/11/16.  Plan: Zosyn 3.375g IV q8h (4 hour infusion).   Patient received Vancomycin 1000 mg IV x 1. Will continue with Vancomycin 750mg  IV Q18h. Ke 0.048, t1/2 14  Vd 30    Using Scr of 0.8.  Will check trough prior to 5th dose on 2/19.     Height: 5' 2.5" (158.8 cm) Weight: 94 lb 8 oz (42.9 kg) IBW/kg (Calculated) : 51.25  Temp (24hrs), Avg:98.2 F (36.8 C), Min:98.2 F (36.8 C), Max:98.2 F (36.8 C)   Recent Labs Lab 03/17/16 1407 03/18/16 1129  WBC 23.2 Repeated and verified X2.* 18.8*  CREATININE 0.53 0.30*    Estimated Creatinine Clearance: 52.5 mL/min (by C-G formula based on SCr of 0.3 mg/dL (L)).    No Known Allergies  Antimicrobials this admission: Zosyn 2/16 >>    Vanc 2/16 >>    Dose adjustments this admission:    Microbiology results:   BCx:  P   UCx:      Sputum:    1/31 previous admission, MRSA PCR: negative 2/16 CXR= PNA 2/16 PCT <0.10  Thank you for allowing pharmacy to be a part of this patient's care.  Cristina Hale A 03/18/2016 3:08 PM

## 2016-03-19 ENCOUNTER — Inpatient Hospital Stay: Payer: BC Managed Care – PPO

## 2016-03-19 LAB — CBC
HCT: 26.7 % — ABNORMAL LOW (ref 35.0–47.0)
HEMOGLOBIN: 8.8 g/dL — AB (ref 12.0–16.0)
MCH: 28.6 pg (ref 26.0–34.0)
MCHC: 33 g/dL (ref 32.0–36.0)
MCV: 86.8 fL (ref 80.0–100.0)
PLATELETS: 331 10*3/uL (ref 150–440)
RBC: 3.07 MIL/uL — AB (ref 3.80–5.20)
RDW: 15 % — ABNORMAL HIGH (ref 11.5–14.5)
WBC: 12 10*3/uL — AB (ref 3.6–11.0)

## 2016-03-19 LAB — BASIC METABOLIC PANEL
ANION GAP: 2 — AB (ref 5–15)
BUN: 13 mg/dL (ref 6–20)
CHLORIDE: 102 mmol/L (ref 101–111)
CO2: 28 mmol/L (ref 22–32)
CREATININE: 0.44 mg/dL (ref 0.44–1.00)
Calcium: 6.9 mg/dL — ABNORMAL LOW (ref 8.9–10.3)
GFR calc Af Amer: >60 mL/min (ref >60)
GFR calc non Af Amer: >60 mL/min (ref >60)
Glucose, Bld: 86 mg/dL (ref 65–99)
POTASSIUM: 3.7 mmol/L (ref 3.5–5.1)
SODIUM: 132 mmol/L — AB (ref 135–145)

## 2016-03-19 LAB — EXPECTORATED SPUTUM ASSESSMENT W REFEX TO RESP CULTURE

## 2016-03-19 LAB — EXPECTORATED SPUTUM ASSESSMENT W GRAM STAIN, RFLX TO RESP C: Special Requests: NORMAL

## 2016-03-19 LAB — PROCALCITONIN: Procalcitonin: <0.1 ng/mL

## 2016-03-19 LAB — LACTIC ACID, PLASMA: Lactic Acid, Venous: 1 mmol/L (ref 0.5–1.9)

## 2016-03-19 LAB — HIV ANTIBODY (ROUTINE TESTING W REFLEX): HIV SCREEN 4TH GENERATION: NONREACTIVE

## 2016-03-19 LAB — MRSA PCR SCREENING: MRSA BY PCR: NEGATIVE

## 2016-03-19 MED ORDER — SODIUM CHLORIDE 0.9 % IV BOLUS (SEPSIS)
500.0000 mL | Freq: Once | INTRAVENOUS | Status: AC
  Administered 2016-03-19: 500 mL via INTRAVENOUS

## 2016-03-19 MED ORDER — DOCUSATE SODIUM 100 MG PO CAPS
100.0000 mg | ORAL_CAPSULE | Freq: Two times a day (BID) | ORAL | Status: DC
  Administered 2016-03-19 – 2016-03-21 (×5): 100 mg via ORAL
  Filled 2016-03-19 (×4): qty 1

## 2016-03-19 MED ORDER — SODIUM CHLORIDE 0.9 % IV BOLUS (SEPSIS)
500.0000 mL | Freq: Once | INTRAVENOUS | Status: AC
  Administered 2016-03-19: 01:00:00 500 mL via INTRAVENOUS

## 2016-03-19 NOTE — Progress Notes (Signed)
[facility] [facility] [place] NAME: Cristina Hale    MR#:  [id number]  DATE OF BIRTH:  August 29, 1960  SUBJECTIVE:  CHIEF COMPLAINT:  Recent admission and long stay in hospital for Influenza and COPD.   Sent back by PMD due to b/l pneumonia.   Still feels very SOB by just walking to bedside commode today morning. Have sputum with cough now.   Hb dropped some compared to baseline. CT shows extensive b/l pneumonia.  REVIEW OF SYSTEMS:  CONSTITUTIONAL: No fever, fatigue or weakness.  EYES: No blurred or double vision.  EARS, NOSE, AND THROAT: No tinnitus or ear pain.  RESPIRATORY: positive for cough, shortness of breath, wheezing ,no hemoptysis.  CARDIOVASCULAR: No chest pain, orthopnea, edema.  GASTROINTESTINAL: No nausea, vomiting, diarrhea or abdominal pain.  GENITOURINARY: No dysuria, hematuria.  ENDOCRINE: No polyuria, nocturia,  HEMATOLOGY: No anemia, easy bruising or bleeding SKIN: No rash or lesion. MUSCULOSKELETAL: No joint pain or arthritis.   NEUROLOGIC: No tingling, numbness, weakness.  PSYCHIATRY: No anxiety or depression.   ROS  DRUG ALLERGIES:  No Known Allergies  VITALS:  Blood pressure (!) 99/37, pulse 84, temperature 97.8 F (36.6 C), temperature source Oral, resp. rate 20, height 5' 2.5" (1.588 m), weight 42.9 kg (94 lb 8 oz), SpO2 96 %.  PHYSICAL EXAMINATION:  GENERAL:  56 y.o.-year-old patient lying in the bed with no acute distress.  EYES: Pupils equal, round, reactive to light and accommodation. No scleral icterus. Extraocular muscles intact.  HEENT: Head atraumatic, normocephalic. Oropharynx and nasopharynx clear.  NECK:  Supple, no jugular venous distention. No thyroid enlargement, no tenderness.  LUNGS: Normal breath sounds bilaterally, no wheezing, b/l crepitation. Positive use of accessory muscles of respiration. Using supplemental oxygen.  CARDIOVASCULAR: S1, S2 normal. No murmurs, rubs, or gallops.  ABDOMEN: Soft,  nontender, nondistended. Bowel sounds present. No organomegaly or mass.  EXTREMITIES: No pedal edema, cyanosis, or clubbing.  NEUROLOGIC: Cranial nerves II through XII are intact. Muscle strength 5/5 in all extremities. Sensation intact. Gait not checked.  PSYCHIATRIC: The patient is alert and oriented x 3.  SKIN: No obvious rash, lesion, or ulcer.   Physical Exam LABORATORY PANEL:   CBC  Recent Labs Lab 03/19/16 0347  WBC 12.0*  HGB 8.8*  HCT 26.7*  PLT 331   ------------------------------------------------------------------------------------------------------------------  Chemistries   Recent Labs Lab 03/17/16 1407  03/19/16 0347  NA 126*  --  132*  K 4.6  --  3.7  CL 89*  --  102  CO2 34*  --  28  GLUCOSE 121*  --  86  BUN 13  --  13  CREATININE 0.53  < > 0.44  CALCIUM 8.5  --  6.9*  AST 26  --   --   ALT 54*  --   --   ALKPHOS 64  --   --   BILITOT 0.5  --   --   < > = values in this interval not displayed. ------------------------------------------------------------------------------------------------------------------  Cardiac Enzymes No results for input(s): TROPONINI in the last 168 hours. ------------------------------------------------------------------------------------------------------------------  RADIOLOGY:  Dg Chest 2 View  Result Date: 03/18/2016 CLINICAL DATA:  Elevated white blood cell count. Shortness of breath EXAM: CHEST  2 VIEW COMPARISON:  03/05/2016 FINDINGS: Patchy bilateral airspace disease compatible with multifocal pneumonia. Underlying COPD. Heart is normal size. Probable small left effusion. No acute bony abnormality. IMPRESSION: COPD. Patchy bilateral airspace opacities compatible with multifocal pneumonia. Small left effusion. Electronically Signed  By: Rolm Baptise M.D.   On: 03/18/2016 08:53   Ct Chest Wo Contrast  Result Date: 03/18/2016 CLINICAL DATA:  Cough, shortness of breath, flu, pneumonia EXAM: CT CHEST WITHOUT CONTRAST  TECHNIQUE: Multidetector CT imaging of the chest was performed following the standard protocol without IV contrast. COMPARISON:  Chest radiographs dated 03/18/2016 FINDINGS: Cardiovascular: Heart is normal in size. Trace anterior pericardial fluid. Mild atherosclerotic calcifications of the aortic arch. Mediastinum/Nodes: Small mediastinal lymph nodes which do not meet pathologic CT size criteria. Visualized thyroid is unremarkable. Lungs/Pleura: Multifocal patchy/nodular opacities in the bilateral upper and lower lobes, left lower lobe predominant, suspicious for multifocal pneumonia. Mild posterior distribution. Underlying mild centrilobular emphysematous changes. Small bilateral pleural effusions, left greater than right. No pneumothorax. Upper Abdomen: Visualized upper abdomen is grossly unremarkable, noting 8 10 mm cyst in the central liver and mild vascular calcifications. Musculoskeletal: Mild degenerative changes of the visualized thoracolumbar spine. IMPRESSION: Multifocal pneumonia in the bilateral upper and lower lobes, left lower lobe predominant. Small bilateral pleural effusions, left greater than right. Electronically Signed   By: Julian Hy M.D.   On: 03/18/2016 12:25    ASSESSMENT AND PLAN:   Principal Problem:   Bilateral pneumonia Active Problems:   Pneumonia  * Bilateral pneumonia   HCAP- recent admission and long stay in hospital for Influenza    MRSA screen was negative 03/02/16.   Given IV zosyn. Pulm added Vanc also as she is high risk.   Try to get sputum cx. May d/c vanc ONLY after sputum result, not just by nasal swab screen.   Pulmonary consult appreciated.   Done CT chest- shows extensive b/l infiltrates.  * Hyponatremia   IV NS and follow. Some improvement.  * COPD, Ch respi failure   No active wheezing.   She is on taper steroid from last admission, finish that tomorrow.   COnt spiriva.   Give nebs.  * elevated WBCs   Due to pneumonia and steroids  use.   Monitor. Came down.  * Anemia   Dropped around 9 from baseline 12.   May be dilution, denies any bleed or dark stool.   Check for stool  guiac.    All the records are reviewed and case discussed with Care Management/Social Workerr. Management plans discussed with the patient, family and they are in agreement.  CODE STATUS: FUll.  TOTAL TIME TAKING CARE OF THIS PATIENT: 35 minutes.   Daughter was present in room during my visit.  POSSIBLE D/C IN 2-3 DAYS, DEPENDING ON CLINICAL CONDITION.   Vaughan Basta M.D on 03/19/2016   Between 7am to 6pm - Pager - [phone]  After 6pm go to [email or web address] - password [email or web address]  Office  [phone]  CC: Primary care physician; Coral Spikes, DO  Note: This dictation was prepared with Dragon dictation along with smaller phrase technology. Any transcriptional errors that result from this process are unintentional.

## 2016-03-19 NOTE — Progress Notes (Signed)
[facility] Progess Note    ASSESSMENT/PLAN   1. Multilobar pneumonia: CT scan revealed extensive multilobar consolidative disease. Agree with sputum analysis to help de-escalating antibiotics. Continue broad-spectrum coverage with vancomycin and Zosyn. Is presently on DuoNeb therapy. Upon discharge would continue Spiriva, prednisone taper and combined long-acting beta agonist/annual corticosteroid. Patient is scheduled to see Dr. Alva Garnet on Wednesday in the outpatient setting. No change in recommendations, we'll sign off. Please call if can be of any further assistance  2. Severe COPD  3. Recent influenza a an acute exacerbation of COPD  Name: Cristina Hale MRN: [id number] DOB: 1960/02/26    ADMISSION DATE:  03/18/2016  SUBJECTIVE:   Patient states she is feeling somewhat better today. She is having some productive sputum and feels like a pneumonia is breaking up. She was able to cough up a specimen   VITAL SIGNS: Temp:  [97.5 F (36.4 C)-97.8 F (36.6 C)] 97.8 F (36.6 C) (02/17 0447) Pulse Rate:  [84-92] 84 (02/17 0447) Resp:  [20] 20 (02/17 0447) BP: (85-99)/(37-49) 99/37 (02/17 0447) SpO2:  [93 %-100 %] 93 % (02/17 0751) FiO2 (%):  [32 %] 32 % (02/17 0751) HEMODYNAMICS:   VENTILATOR SETTINGS: FiO2 (%):  [32 %] 32 % INTAKE / OUTPUT:  Intake/Output Summary (Last 24 hours) at 03/19/16 1044 Last data filed at 03/19/16 1044  Gross per 24 hour  Intake          1373.75 ml  Output                0 ml  Net          1373.75 ml    PHYSICAL EXAMINATION: Physical Examination:   VS: BP (!) 99/37 (BP Location: Right Arm)   Pulse 84   Temp 97.8 F (36.6 C) (Oral)   Resp 20   Ht 5' 2.5" (1.588 m)   Wt 42.9 kg (94 lb 8 oz)   SpO2 93%   BMI 17.01 kg/m   General Appearance: No distress  Neuro:without focal findings, mental status normal. HEENT: PERRLA, EOM intact. Pulmonary: Prolonged expiratory phase, markedly diminished breath  sounds CardiovascularNormal S1,S2.  Distant heart sounds Abdomen: Benign, Soft, non-tender. Skin:   warm, no rashes, no ecchymosis  Extremities: normal, no cyanosis, clubbing.   LABS:   LABORATORY PANEL:   CBC  Recent Labs Lab 03/19/16 0347  WBC 12.0*  HGB 8.8*  HCT 26.7*  PLT 331    Chemistries   Recent Labs Lab 03/17/16 1407  03/19/16 0347  NA 126*  --  132*  K 4.6  --  3.7  CL 89*  --  102  CO2 34*  --  28  GLUCOSE 121*  --  86  BUN 13  --  13  CREATININE 0.53  < > 0.44  CALCIUM 8.5  --  6.9*  AST 26  --   --   ALT 54*  --   --   ALKPHOS 64  --   --   BILITOT 0.5  --   --   < > = values in this interval not displayed.  No results for input(s): GLUCAP in the last 168 hours. No results for input(s): PHART, PCO2ART, PO2ART in the last 168 hours.  Recent Labs Lab 03/17/16 1407  AST 26  ALT 54*  ALKPHOS 64  BILITOT 0.5  ALBUMIN 2.8*    Cardiac Enzymes No results for input(s): TROPONINI in the last 168 hours.  RADIOLOGY:  Dg Chest  2 View  Result Date: 03/19/2016 CLINICAL DATA:  Recent pneumonia.  History of cervical carcinoma EXAM: CHEST  2 VIEW COMPARISON:  Chest radiograph and chest CT March 19, 2015 FINDINGS: Extensive areas of airspace consolidation bilaterally remain. Consolidation is greatest in the left lower lobe. There are small pleural effusions bilaterally. No new areas of airspace opacity identified. Heart size and pulmonary vascularity are normal. No adenopathy apparent by radiography. There is aortic atherosclerosis. No bone lesions. IMPRESSION: Multifocal pneumonitis, most severe in the left lower lobe, remains without change. Small pleural effusions remain. No new opacity. Stable cardiac silhouette. There is aortic atherosclerosis. Electronically Signed   By: Lowella Grip III M.D.   On: 03/19/2016 09:00   Dg Chest 2 View  Result Date: 03/18/2016 CLINICAL DATA:  Elevated white blood cell count. Shortness of breath EXAM: CHEST  2  VIEW COMPARISON:  03/05/2016 FINDINGS: Patchy bilateral airspace disease compatible with multifocal pneumonia. Underlying COPD. Heart is normal size. Probable small left effusion. No acute bony abnormality. IMPRESSION: COPD. Patchy bilateral airspace opacities compatible with multifocal pneumonia. Small left effusion. Electronically Signed   By: Rolm Baptise M.D.   On: 03/18/2016 08:53   Ct Chest Wo Contrast  Result Date: 03/18/2016 CLINICAL DATA:  Cough, shortness of breath, flu, pneumonia EXAM: CT CHEST WITHOUT CONTRAST TECHNIQUE: Multidetector CT imaging of the chest was performed following the standard protocol without IV contrast. COMPARISON:  Chest radiographs dated 03/18/2016 FINDINGS: Cardiovascular: Heart is normal in size. Trace anterior pericardial fluid. Mild atherosclerotic calcifications of the aortic arch. Mediastinum/Nodes: Small mediastinal lymph nodes which do not meet pathologic CT size criteria. Visualized thyroid is unremarkable. Lungs/Pleura: Multifocal patchy/nodular opacities in the bilateral upper and lower lobes, left lower lobe predominant, suspicious for multifocal pneumonia. Mild posterior distribution. Underlying mild centrilobular emphysematous changes. Small bilateral pleural effusions, left greater than right. No pneumothorax. Upper Abdomen: Visualized upper abdomen is grossly unremarkable, noting 8 10 mm cyst in the central liver and mild vascular calcifications. Musculoskeletal: Mild degenerative changes of the visualized thoracolumbar spine. IMPRESSION: Multifocal pneumonia in the bilateral upper and lower lobes, left lower lobe predominant. Small bilateral pleural effusions, left greater than right. Electronically Signed   By: Julian Hy M.D.   On: 03/18/2016 12:25     Hermelinda Dellen, DO  03/19/2016

## 2016-03-20 LAB — BASIC METABOLIC PANEL
ANION GAP: 4 — AB (ref 5–15)
BUN: 9 mg/dL (ref 6–20)
CALCIUM: 7.6 mg/dL — AB (ref 8.9–10.3)
CO2: 29 mmol/L (ref 22–32)
Chloride: 100 mmol/L — ABNORMAL LOW (ref 101–111)
GLUCOSE: 83 mg/dL (ref 65–99)
Potassium: 3.8 mmol/L (ref 3.5–5.1)
Sodium: 133 mmol/L — ABNORMAL LOW (ref 135–145)

## 2016-03-20 LAB — CBC
HCT: 27.4 % — ABNORMAL LOW (ref 35.0–47.0)
Hemoglobin: 9.1 g/dL — ABNORMAL LOW (ref 12.0–16.0)
MCH: 28.8 pg (ref 26.0–34.0)
MCHC: 33.3 g/dL (ref 32.0–36.0)
MCV: 86.7 fL (ref 80.0–100.0)
PLATELETS: 390 10*3/uL (ref 150–440)
RBC: 3.17 MIL/uL — ABNORMAL LOW (ref 3.80–5.20)
RDW: 14.7 % — AB (ref 11.5–14.5)
WBC: 11.5 10*3/uL — AB (ref 3.6–11.0)

## 2016-03-20 LAB — OCCULT BLOOD X 1 CARD TO LAB, STOOL: Fecal Occult Bld: NEGATIVE

## 2016-03-20 LAB — PROCALCITONIN: Procalcitonin: <0.1 ng/mL

## 2016-03-20 MED ORDER — IPRATROPIUM-ALBUTEROL 0.5-2.5 (3) MG/3ML IN SOLN
3.0000 mL | Freq: Three times a day (TID) | RESPIRATORY_TRACT | Status: DC
  Administered 2016-03-20 – 2016-03-21 (×5): 3 mL via RESPIRATORY_TRACT
  Filled 2016-03-20 (×5): qty 3

## 2016-03-20 MED ORDER — IBUPROFEN 200 MG PO TABS
400.0000 mg | ORAL_TABLET | Freq: Four times a day (QID) | ORAL | Status: DC | PRN
  Administered 2016-03-20: 400 mg via ORAL
  Filled 2016-03-20: qty 2

## 2016-03-20 MED ORDER — IPRATROPIUM-ALBUTEROL 0.5-2.5 (3) MG/3ML IN SOLN: 3.0000 mL | Freq: Four times a day (QID) | RESPIRATORY_TRACT | Status: DC | PRN

## 2016-03-20 NOTE — Plan of Care (Signed)
Problem: Education: Goal: Knowledge of [facility] Education information/materials will improve Outcome: Progressing Pt hoping to be discharged home tomorrow; sputum culture results pending (originally sent on 03/19/16)

## 2016-03-20 NOTE — Progress Notes (Signed)
[facility] [place] NAME: Cristina Hale    MR#:  [id number]  DATE OF BIRTH:  [birth year]  SUBJECTIVE:  CHIEF COMPLAINT:  Recent admission and long stay in hospital for Influenza and COPD.   Sent back by PMD due to b/l pneumonia.      Hb dropped some compared to baseline. CT shows extensive b/l pneumonia.  repeat Xray stable, today feels less SOB and able to walk up to bathroom.  REVIEW OF SYSTEMS:  CONSTITUTIONAL: No fever, fatigue or weakness.  EYES: No blurred or double vision.  EARS, NOSE, AND THROAT: No tinnitus or ear pain.  RESPIRATORY: positive for cough, shortness of breath, wheezing ,no hemoptysis.  CARDIOVASCULAR: No chest pain, orthopnea, edema.  GASTROINTESTINAL: No nausea, vomiting, diarrhea or abdominal pain.  GENITOURINARY: No dysuria, hematuria.  ENDOCRINE: No polyuria, nocturia,  HEMATOLOGY: No anemia, easy bruising or bleeding SKIN: No rash or lesion. MUSCULOSKELETAL: No joint pain or arthritis.   NEUROLOGIC: No tingling, numbness, weakness.  PSYCHIATRY: No anxiety or depression.   ROS  DRUG ALLERGIES:  No Known Allergies  VITALS:  Blood pressure (!) 106/46, pulse 100, temperature 97.8 F (36.6 C), temperature source Oral, resp. rate 20, height 5' 2.5" (1.588 m), weight 42.9 kg (94 lb 8 oz), SpO2 93 %.  PHYSICAL EXAMINATION:  GENERAL:  55 y.o.-year-old patient lying in the bed with no acute distress.  EYES: Pupils equal, round, reactive to light and accommodation. No scleral icterus. Extraocular muscles intact.  HEENT: Head atraumatic, normocephalic. Oropharynx and nasopharynx clear.  NECK:  Supple, no jugular venous distention. No thyroid enlargement, no tenderness.  LUNGS: Normal breath sounds bilaterally, no wheezing, b/l crepitation. no use of accessory muscles of respiration. Using supplemental oxygen.  CARDIOVASCULAR: S1, S2 normal. No murmurs, rubs, or gallops.  ABDOMEN: Soft, nontender, nondistended. Bowel  sounds present. No organomegaly or mass.  EXTREMITIES: No pedal edema, cyanosis, or clubbing.  NEUROLOGIC: Cranial nerves II through XII are intact. Muscle strength 5/5 in all extremities. Sensation intact. Gait not checked.  PSYCHIATRIC: The patient is alert and oriented x 3.  SKIN: No obvious rash, lesion, or ulcer.   Physical Exam LABORATORY PANEL:   CBC  Recent Labs Lab 03/20/16 0438  WBC 11.5*  HGB 9.1*  HCT 27.4*  PLT 390   ------------------------------------------------------------------------------------------------------------------  Chemistries   Recent Labs Lab 03/17/16 1407  03/20/16 0438  NA 126*  < > 133*  K 4.6  < > 3.8  CL 89*  < > 100*  CO2 34*  < > 29  GLUCOSE 121*  < > 83  BUN 13  < > 9  CREATININE 0.53  < > <0.30*  CALCIUM 8.5  < > 7.6*  AST 26  --   --   ALT 54*  --   --   ALKPHOS 64  --   --   BILITOT 0.5  --   --   < > = values in this interval not displayed. ------------------------------------------------------------------------------------------------------------------  Cardiac Enzymes No results for input(s): TROPONINI in the last 168 hours. ------------------------------------------------------------------------------------------------------------------  RADIOLOGY:  Dg Chest 2 View  Result Date: 03/19/2016 CLINICAL DATA:  Recent pneumonia.  History of cervical carcinoma EXAM: CHEST  2 VIEW COMPARISON:  Chest radiograph and chest CT March 19, 2015 FINDINGS: Extensive areas of airspace consolidation bilaterally remain. Consolidation is greatest in the left lower lobe. There are small pleural effusions bilaterally. No new areas of airspace opacity identified. Heart size and pulmonary vascularity are  normal. No adenopathy apparent by radiography. There is aortic atherosclerosis. No bone lesions. IMPRESSION: Multifocal pneumonitis, most severe in the left lower lobe, remains without change. Small pleural effusions remain. No new opacity.  Stable cardiac silhouette. There is aortic atherosclerosis. Electronically Signed   By: Lowella Grip III M.D.   On: 03/19/2016 09:00    ASSESSMENT AND PLAN:   Principal Problem:   Bilateral pneumonia Active Problems:   Pneumonia  * Bilateral pneumonia   HCAP- recent admission and long stay in hospital for Influenza    MRSA screen was negative 03/02/16 and again this admission.   Given IV zosyn. Pulm added Vanc also as she is high risk.   Follow sputum cx. May d/c vanc ONLY after sputum result, not just by nasal swab screen.   Pulmonary consult appreciated.   Done CT chest- shows extensive b/l infiltrates.   Pt feels symptomatically better.  * Hyponatremia   IV NS and follow.  improvement.  * COPD, Ch respi failure   No active wheezing.   She is on taper steroid from last admission, finish that tomorrow.   COnt spiriva.   Give nebs.  * elevated WBCs   Due to pneumonia and steroids use.   Monitor. Came down.  * Anemia   Dropped around 9 from baseline 12.   May be dilution, denies any bleed or dark stool.   Checked for stool  guiac- negative.    All the records are reviewed and case discussed with Care Management/Social Workerr. Management plans discussed with the patient, family and they are in agreement.  CODE STATUS: FUll.  TOTAL TIME TAKING CARE OF THIS PATIENT: 35 minutes.   Daughter was present in room during my visit.  POSSIBLE D/C IN 2-3 DAYS, DEPENDING ON CLINICAL CONDITION.   Vaughan Basta M.D on 03/20/2016   Between 7am to 6pm - Pager - [phone]  After 6pm go to [email or web address] - password [email or web address] [facility]  Office  [phone]  CC: Primary care physician; Coral Spikes, DO  Note: This dictation was prepared with Dragon dictation along with smaller phrase technology. Any transcriptional errors that result from this process are unintentional.

## 2016-03-20 NOTE — Progress Notes (Signed)
Pharmacy Antibiotic Note  Cristina Hale is a 56 y.o. female admitted on 03/18/2016 with pneumonia/?HCAP.  Pharmacy has been consulted for Zosyn and Vancomycin dosing.  Recent admission for Influenza + and AECOPD 03/02/16- 03/11/16.  Plan: Zosyn 3.375g IV q8h (4 hour infusion).   Vancomycin 750mg  IV Q18hr for goal trough of 15-20. Will obtain trough at 1430 on 2/19.   Height: 5' 2.5" (158.8 cm) Weight: 94 lb 8 oz (42.9 kg) IBW/kg (Calculated) : 51.25  Temp (24hrs), Avg:98 F (36.7 C), Min:97.8 F (36.6 C), Max:98.3 F (36.8 C)   Recent Labs Lab 03/17/16 1407 03/18/16 1129 03/19/16 0347 03/20/16 0438  WBC 23.2 Repeated and verified X2.* 18.8* 12.0* 11.5*  CREATININE 0.53 0.30* 0.44 <0.30*  LATICACIDVEN  --   --  1.0  --     CrCl cannot be calculated (This lab value cannot be used to calculate CrCl because it is not a number: <0.30).    No Known Allergies  Antimicrobials this admission: Zosyn 2/16 >>   Vanc 2/16 >>    Dose adjustments this admission:    Microbiology results:  2/16 BCx: No growth x 2 days  2/17 Sputum: too young to read   1/31 previous admission, MRSA PCR: negative 2/16 CXR= PNA 2/16 PCT <0.10  Thank you for allowing pharmacy to be a part of this patient's care.  Bennett Vanscyoc L 03/20/2016 1:03 PM

## 2016-03-21 LAB — CULTURE, RESPIRATORY W GRAM STAIN
Culture: NORMAL
Special Requests: NORMAL

## 2016-03-21 MED ORDER — LEVOFLOXACIN 500 MG PO TABS: 500.0000 mg | ORAL_TABLET | Freq: Every day | ORAL | 0 refills | Status: DC

## 2016-03-21 MED ORDER — IPRATROPIUM-ALBUTEROL 0.5-2.5 (3) MG/3ML IN SOLN: 3.0000 mL | Freq: Four times a day (QID) | RESPIRATORY_TRACT | 0 refills | Status: AC | PRN

## 2016-03-21 MED ORDER — DOXYCYCLINE HYCLATE 50 MG PO CAPS: 50.0000 mg | ORAL_CAPSULE | Freq: Two times a day (BID) | ORAL | 0 refills | Status: AC

## 2016-03-21 NOTE — Progress Notes (Signed)
Initial Nutrition Assessment  DOCUMENTATION CODES:   Underweight  INTERVENTION:  Encouraged patient to continue snacks daily between meals.   NUTRITION DIAGNOSIS:   Increased nutrient needs related to acute illness (COPD) as evidenced by estimated needs.  GOAL:   Patient will meet greater than or equal to 90% of their needs  MONITOR:   PO intake, Labs, I & O's, Weight trends  REASON FOR ASSESSMENT:   Other (Comment) (Low BMI)    ASSESSMENT:   56 year old female with PMHx of COPD admitted with bilateral pneumonia. Patient with recent admission for influenza and COPD.   Patient known to this RD from previous admission. Spoke with patient and daughter at bedside. Her appetite has continued to improve since discharge and she has been eating 3 meals daily with snacks between meals. She reports she can taste food well and enjoys eating again. Denies N/V, abdominal pain.   Patient reports she has been gaining weight since discharge due to improved intake. Patient was 89 lbs on 2/4 and now weighs 94 lbs.   Medications reviewed and include: Colace, Zosyn, prednisone 10 mg daily, vancomycin, NS @ 75 ml/hr.   Labs reviewed: Sodium 133, Chloride 100, Creatinine <0.3.   Nutrition-Focused physical exam completed. Findings are no fat depletion, no muscle depletion, and no edema.   Diet Order:  Diet regular Room service appropriate? Yes; Fluid consistency: Thin Diet - low sodium heart healthy  Skin:  Reviewed, no issues  Last BM:  03/21/2016  Height:   Ht Readings from Last 1 Encounters:  03/18/16 5' 2.5" (1.588 m)    Weight:   Wt Readings from Last 1 Encounters:  03/18/16 94 lb 8 oz (42.9 kg)    Ideal Body Weight:  50 kg  BMI:  Body mass index is 17.01 kg/m.  Estimated Nutritional Needs:   Kcal:  1500-1700 (35-40 kcal/kg)  Protein:  65-80 grams (1.5-1.8 grams/kg)  Fluid:  1.5 L/day (35 ml/kg)  EDUCATION NEEDS:   No education needs identified at this  time  Willey Blade, MS, RD, LDN Pager: [phone] After Hours Pager: [phone]

## 2016-03-21 NOTE — Care Management (Signed)
Spoke with Dr. Anselm Jungling this morning. Will need nebulizer for home use when discharged.  Floydene Flock, [facility] representative updated. Discharge to home today per Dr. Devoria Glassing RN MSN CCM Care Management

## 2016-03-21 NOTE — Progress Notes (Signed)
Patient discharged home per MD order. Prescriptions given to patient. All discharge instructions given and all questions answered. 

## 2016-03-21 NOTE — Discharge Summary (Signed)
[facility] NAME: Cristina Hale    MR#:  [id number]  DATE OF BIRTH:  04-20-1960  DATE OF ADMISSION:  03/18/2016 ADMITTING PHYSICIAN: Bettey Costa, MD  DATE OF DISCHARGE: 03/21/2016  PRIMARY CARE PHYSICIAN: Coral Spikes, DO    ADMISSION DIAGNOSIS:  pneumonia  DISCHARGE DIAGNOSIS:  Principal Problem:   Bilateral pneumonia Active Problems:   Pneumonia   SECONDARY DIAGNOSIS:   Past Medical History:  Diagnosis Date  . Cervical cancer ([place])   . COPD (chronic obstructive pulmonary disease) ([place])   . Pneumonia     HOSPITAL COURSE:   * Bilateral pneumonia HCAP- recent admission and long stay in hospital for Influenza  MRSA screen was negative 03/02/16 and again this admission. Given IV zosyn. Pulm added Vanc also as she is high risk. Follow sputum cx. May d/c vanc ONLY after sputum result, not just by nasal swab screen. Pulmonary consult appreciated. Done CT chest- shows extensive b/l infiltrates.   Pt feels symptomatically better.   Sputum cx is still not finalized, so will d/c on oral Doxy + levaquin and Dr. Alva Garnet can follow result in office in 2 days.  * Hyponatremia IV NS and follow.  improvement.  * COPD, Ch respi failure No active wheezing. She is on taper steroid from last admission, finished that. COnt spiriva. Give nebs.  * elevated WBCs Due to pneumonia and steroids use. Monitor. Came down.  * Anemia   Dropped around 9 from baseline 12.   May be dilution, denies any bleed or dark stool.   Checked for stool  guiac- negative.   Stable.   DISCHARGE CONDITIONS:   Stable.  CONSULTS OBTAINED:    DRUG ALLERGIES:  No Known Allergies  DISCHARGE MEDICATIONS:   Current Discharge Medication List    START taking these medications   Details  doxycycline (VIBRAMYCIN) 50 MG capsule Take 1 capsule (50 mg total) by mouth 2 (two) times daily. Qty: 12 capsule,  Refills: 0    ipratropium-albuterol (DUONEB) 0.5-2.5 (3) MG/3ML SOLN Take 3 mLs by nebulization every 6 (six) hours as needed (shortness of breath). Qty: 360 mL, Refills: 0    levofloxacin (LEVAQUIN) 500 MG tablet Take 1 tablet (500 mg total) by mouth daily. Qty: 6 tablet, Refills: 0      CONTINUE these medications which have NOT CHANGED   Details  albuterol (PROVENTIL HFA;VENTOLIN HFA) 108 (90 Base) MCG/ACT inhaler Inhale 1-2 puffs into the lungs every 4 (four) hours as needed for wheezing or shortness of breath.    Fluticasone-Salmeterol (ADVAIR) 250-50 MCG/DOSE AEPB Inhale 1 puff into the lungs 2 (two) times daily. Qty: 2 each, Refills: 0    tiotropium (SPIRIVA HANDIHALER) 18 MCG inhalation capsule Place 1 capsule (18 mcg total) into inhaler and inhale daily. Qty: 30 capsule, Refills: 2      STOP taking these medications     benzonatate (TESSALON) 100 MG capsule      chlorpheniramine-HYDROcodone (TUSSIONEX) 10-8 MG/5ML SUER          DISCHARGE INSTRUCTIONS:    Follow with Pulmonary clinic in 2 days.  If you experience worsening of your admission symptoms, develop shortness of breath, life threatening emergency, suicidal or homicidal thoughts you must seek medical attention immediately by calling 911 or calling your MD immediately  if symptoms less severe.  You Must read complete instructions/literature along with all the possible adverse reactions/side effects for all the Medicines you take and that have been prescribed  to you. Take any new Medicines after you have completely understood and accept all the possible adverse reactions/side effects.   Please note  You were cared for by a hospitalist during your hospital stay. If you have any questions about your discharge medications or the care you received while you were in the hospital after you are discharged, you can call the unit and asked to speak with the hospitalist on call if the hospitalist that took care of you is  not available. Once you are discharged, your primary care physician will handle any further medical issues. Please note that NO REFILLS for any discharge medications will be authorized once you are discharged, as it is imperative that you return to your primary care physician (or establish a relationship with a primary care physician if you do not have one) for your aftercare needs so that they can reassess your need for medications and monitor your lab values.    Today   CHIEF COMPLAINT:  No chief complaint on file.   HISTORY OF PRESENT ILLNESS:  Cristina Hale  is a 56 y.o. female with a known history of COPD, recent admission for Influenza and had a long hospital stay, sent home with supplemental oxygen and tapering steroids. Continue to have intermittent cough- went for routine follow up with PMD- he did Xray chest- which showed multifocal PNA and sent her for direct admission. Pt denies sputum production or fever.  VITAL SIGNS:  Blood pressure (!) 94/42, pulse 92, temperature 98.6 F (37 C), temperature source Oral, resp. rate 16, height 5' 2.5" (1.588 m), weight 42.9 kg (94 lb 8 oz), SpO2 94 %.  I/O:   Intake/Output Summary (Last 24 hours) at 03/21/16 1432 Last data filed at 03/21/16 1000  Gross per 24 hour  Intake              478 ml  Output                0 ml  Net              478 ml    PHYSICAL EXAMINATION:   GENERAL:  56 y.o.-year-old patient lying in the bed with no acute distress.  EYES: Pupils equal, round, reactive to light and accommodation. No scleral icterus. Extraocular muscles intact.  HEENT: Head atraumatic, normocephalic. Oropharynx and nasopharynx clear.  NECK:  Supple, no jugular venous distention. No thyroid enlargement, no tenderness.  LUNGS: Normal breath sounds bilaterally, no wheezing, b/l crepitation. no use of accessory muscles of respiration. Using supplemental oxygen.  CARDIOVASCULAR: S1, S2 normal. No murmurs, rubs, or gallops.  ABDOMEN: Soft,  nontender, nondistended. Bowel sounds present. No organomegaly or mass.  EXTREMITIES: No pedal edema, cyanosis, or clubbing.  NEUROLOGIC: Cranial nerves II through XII are intact. Muscle strength 5/5 in all extremities. Sensation intact. Gait not checked.  PSYCHIATRIC: The patient is alert and oriented x 3.  SKIN: No obvious rash, lesion, or ulcer.   DATA REVIEW:   CBC  Recent Labs Lab 03/20/16 0438  WBC 11.5*  HGB 9.1*  HCT 27.4*  PLT 390    Chemistries   Recent Labs Lab 03/17/16 1407  03/20/16 0438  NA 126*  < > 133*  K 4.6  < > 3.8  CL 89*  < > 100*  CO2 34*  < > 29  GLUCOSE 121*  < > 83  BUN 13  < > 9  CREATININE 0.53  < > <0.30*  CALCIUM 8.5  < > 7.6*  AST 26  --   --   ALT 54*  --   --   ALKPHOS 64  --   --   BILITOT 0.5  --   --   < > = values in this interval not displayed.  Cardiac Enzymes No results for input(s): TROPONINI in the last 168 hours.  Microbiology Results  Results for orders placed or performed during the hospital encounter of 03/18/16  CULTURE, BLOOD (ROUTINE X 2) w Reflex to ID Panel     Status: None (Preliminary result)   Collection Time: 03/18/16 11:28 AM  Result Value Ref Range Status   Specimen Description BLOOD RIGHT ASSIST CONTROL  Final   Special Requests BOTTLES DRAWN AEROBIC AND ANAEROBIC AER6ML ANA6ML  Final   Culture NO GROWTH 3 DAYS  Final   Report Status PENDING  Incomplete  CULTURE, BLOOD (ROUTINE X 2) w Reflex to ID Panel     Status: None (Preliminary result)   Collection Time: 03/18/16 11:29 AM  Result Value Ref Range Status   Specimen Description BLOOD LEFT ASSIST CONTROL  Final   Special Requests BOTTLES DRAWN AEROBIC AND ANAEROBIC ANA6ML AER12ML  Final   Culture NO GROWTH 3 DAYS  Final   Report Status PENDING  Incomplete  Culture, expectorated sputum-assessment     Status: None   Collection Time: 03/19/16  8:32 AM  Result Value Ref Range Status   Specimen Description EXPECTORATED SPUTUM  Final   Special Requests  Normal  Final   Sputum evaluation THIS SPECIMEN IS ACCEPTABLE FOR SPUTUM CULTURE  Final   Report Status 03/19/2016 FINAL  Final  MRSA PCR Screening     Status: None   Collection Time: 03/19/16  8:32 AM  Result Value Ref Range Status   MRSA by PCR NEGATIVE NEGATIVE Final    Comment:        The GeneXpert MRSA Assay (FDA approved for NASAL specimens only), is one component of a comprehensive MRSA colonization surveillance program. It is not intended to diagnose MRSA infection nor to guide or monitor treatment for MRSA infections.   Culture, respiratory (NON-Expectorated)     Status: None (Preliminary result)   Collection Time: 03/19/16  8:32 AM  Result Value Ref Range Status   Specimen Description EXPECTORATED SPUTUM  Final   Special Requests Normal Reflexed from [id number]  Final   Gram Stain   Final    MODERATE WBC PRESENT, PREDOMINANTLY PMN FEW SQUAMOUS EPITHELIAL CELLS PRESENT FEW GRAM POSITIVE RODS RARE GRAM NEGATIVE RODS RARE GRAM NEGATIVE COCCI IN PAIRS    Culture   Final    CULTURE REINCUBATED FOR BETTER GROWTH Performed at [facility], [place] [address], [place], [place] [address]    Report Status PENDING  Incomplete    RADIOLOGY:  No results found.  EKG:  No orders found for this or any previous visit.    Management plans discussed with the patient, family and they are in agreement.  CODE STATUS:     Code Status Orders        Start     Ordered   03/18/16 1118  Full code  Continuous     03/18/16 1117    Code Status History    Date Active Date Inactive Code Status Order ID Comments User Context   03/02/2016  7:41 PM 03/11/2016  5:39 PM Full Code [id number]  Holley Raring, NP ED      TOTAL TIME TAKING CARE OF THIS PATIENT: 35 minutes.  Vaughan Basta M.D on 03/21/2016 at 2:32 PM  Between 7am to 6pm - Pager - [phone]  After 6pm go to [email or web address] - password [email or web address] [facility]  Office   [phone]  CC: Primary care physician; Coral Spikes, DO   Note: This dictation was prepared with Dragon dictation along with smaller phrase technology. Any transcriptional errors that result from this process are unintentional.

## 2016-03-21 NOTE — Progress Notes (Signed)
Dr Anselm Jungling notified of low blood pressure. MD acknowledged, no new orders given.

## 2016-03-21 NOTE — Progress Notes (Signed)
While rounding, [place] made initial visit to room 105. Pt was sitting on edge of bed. Daughter and Sister were bedside. Pt stated that she is hoping for discharge today. Pt did not indicate a need for spiritual support at this time.     03/21/16 1100  Clinical Encounter Type  Visited With Patient;Patient and family together  Visit Type Initial;Spiritual support

## 2016-03-22 NOTE — Progress Notes (Signed)
* [facility]     Assessment and Plan:  56 F smoker with previously undiagnosed emphysema (appears severe by CXR) admitted 03/03/15 with AECOPD and influenza A positive. Readmitted 03/18/16 with multifocal pneumonia.   Pneumonia, hospital associated vs. Acquired.  --There appears to be a significant LLL consolidation; will continue/extend course of Levaquin for an additional 5 days. -She appears to be clinically improved today, with improved breathing, and increased exercise tolerance.  Left empyema.  -Small rim in the left lung, too small for an intervention. Will need to continue to observe, repeat CT in 4 weeks to ensure that this and the pneumonia are resolving. If not, the patient may require drainage.    COPD exacerbation, acute on chronic hypoxic respiratory failure.  -COPD is a new diagnosis for this patient.  --Patient will need PFT when more stable.   Chronic hypercapnic respiratory failure.  --Hypoventilation likely due to emphysema.  --Baseline arterial blood gas 03/02/16; 7.4/50/101/31 on oxygen.   Nicotine abuse.  --She stopped smoking approximately 2 months ago, and is strongly encouraged not to restart.    Date: 03/22/2016  MRN# [id number] BRAILYN HOWSE 04/12/1960   Cristina Hale is a 56 y.o. old female seen in follow up for chief complaint of  Chief Complaint  Patient presents with  . Hospitalization Follow-up    SOB w/exertion: dry cough; chest tightness;     HPI:   The patient is a 56 yo smoker who was admitted from 03/03/15 to 03/11/16 for severe exacerbation of previously undiagnosed severe emphysema. She was discharged on oxygen, however she presented back to the hospital on 03/18/16 and admitted for multilobar pneumonia, discharged 03/21/16, on oxygen, levaquin, doxycycline.  Review of CT chest images from 03/18/16 shows severe multilobar pneumonia affecting all lobes, worse in the bases, particularly in the LLL. In the left base there  was also a small rim of empyema. Sputum culture was unrevealing.   Today she notes that she has been somewhat better, she can take deeper breaths, however she remains weak and tired. She has remained off nicotine since January 26th.   She is on spiriva once daily, she has not been on advair. She has a nebulizer at home but has only used it twice. She remains on abx from the hospital for a 6 day course. She denies fevers chills. She does not feel that she is wheezing.   She remains on 3L of oxygen pulse dose ambulatory and 3L continuous at home.    Oxygen saturation today 2/21 on RA at rest was 87%, started on 3L pulse dose, sat increased to 91%.  She was ambulated in the office with main good tolerance to exercise.     Medication:   Outpatient Encounter Prescriptions as of 03/23/2016  Medication Sig  . albuterol (PROVENTIL HFA;VENTOLIN HFA) 108 (90 Base) MCG/ACT inhaler Inhale 1-2 puffs into the lungs every 4 (four) hours as needed for wheezing or shortness of breath.  . doxycycline (VIBRAMYCIN) 50 MG capsule Take 1 capsule (50 mg total) by mouth 2 (two) times daily.  . Fluticasone-Salmeterol (ADVAIR) 250-50 MCG/DOSE AEPB Inhale 1 puff into the lungs 2 (two) times daily.  Marland Kitchen ipratropium-albuterol (DUONEB) 0.5-2.5 (3) MG/3ML SOLN Take 3 mLs by nebulization every 6 (six) hours as needed (shortness of breath).  Marland Kitchen levofloxacin (LEVAQUIN) 500 MG tablet Take 1 tablet (500 mg total) by mouth daily.  Marland Kitchen tiotropium (SPIRIVA HANDIHALER) 18 MCG inhalation capsule Place 1 capsule (18 mcg total) into inhaler and  inhale daily.   No facility-administered encounter medications on file as of 03/23/2016.      Allergies:  Patient has no known allergies.  Review of Systems: Gen:  Denies  fever, sweats. HEENT: Denies blurred vision. Cvc:  No dizziness, chest pain or heaviness Resp:   Denies cough or sputum porduction. Gi: Denies swallowing difficulty, stomach pain. constipation, bowel incontinence Gu:   Denies bladder incontinence, burning urine Ext:   No Joint pain, stiffness. Skin: No skin rash, easy bruising. Endoc:  No polyuria, polydipsia. Psych: No depression, insomnia. Other:  All other systems were reviewed and found to be negative other than what is mentioned in the HPI.   Physical Examination:   VS: BP 118/80 (BP Location: Left Arm, Cuff Size: Normal)   Pulse 84   Wt 97 lb (44 kg)   SpO2 96%   BMI 17.46 kg/m   General Appearance: No distress  Neuro:without focal findings,  speech normal,  HEENT: PERRLA, EOM intact. Pulmonary: Decreased air entry bilaterally. CardiovascularNormal S1,S2.  No m/r/g.   Abdomen: Benign, Soft, non-tender. Renal:  No costovertebral tenderness  GU:  Not performed at this time. Endoc: No evident thyromegaly, no signs of acromegaly. Skin:   warm, no rash. Extremities: normal, no cyanosis, clubbing.   LABORATORY PANEL:   CBC  Recent Labs Lab 03/20/16 0438  WBC 11.5*  HGB 9.1*  HCT 27.4*  PLT 390   ------------------------------------------------------------------------------------------------------------------  Chemistries   Recent Labs Lab 03/17/16 1407  03/20/16 0438  NA 126*  < > 133*  K 4.6  < > 3.8  CL 89*  < > 100*  CO2 34*  < > 29  GLUCOSE 121*  < > 83  BUN 13  < > 9  CREATININE 0.53  < > <0.30*  CALCIUM 8.5  < > 7.6*  AST 26  --   --   ALT 54*  --   --   ALKPHOS 64  --   --   BILITOT 0.5  --   --   < > = values in this interval not displayed. ------------------------------------------------------------------------------------------------------------------  Cardiac Enzymes No results for input(s): TROPONINI in the last 168 hours. ------------------------------------------------------------  RADIOLOGY:   No results found for this or any previous visit. Results for orders placed during the hospital encounter of 03/18/16  DG Chest 2 View   Narrative CLINICAL DATA:  Recent pneumonia.  History of cervical  carcinoma  EXAM: CHEST  2 VIEW  COMPARISON:  Chest radiograph and chest CT March 19, 2015  FINDINGS: Extensive areas of airspace consolidation bilaterally remain. Consolidation is greatest in the left lower lobe. There are small pleural effusions bilaterally. No new areas of airspace opacity identified.  Heart size and pulmonary vascularity are normal. No adenopathy apparent by radiography. There is aortic atherosclerosis. No bone lesions.  IMPRESSION: Multifocal pneumonitis, most severe in the left lower lobe, remains without change. Small pleural effusions remain. No new opacity. Stable cardiac silhouette. There is aortic atherosclerosis.   Electronically Signed   By: Lowella Grip III M.D.   On: 03/19/2016 09:00    ------------------------------------------------------------------------------------------------------------------  Thank  you for allowing [facility] to assist in the care of your patient. Our recommendations are noted above.  Please contact us if we can be of further service.   Marda Stalker, MD.  [facility] Office Number: [phone]  Patricia Pesa, M.D.  Vilinda Boehringer, M.D.  Merton Border, M.D  03/22/2016

## 2016-03-23 ENCOUNTER — Ambulatory Visit (INDEPENDENT_AMBULATORY_CARE_PROVIDER_SITE_OTHER): Payer: BC Managed Care – PPO | Admitting: Internal Medicine

## 2016-03-23 ENCOUNTER — Encounter: Payer: Self-pay | Admitting: Internal Medicine

## 2016-03-23 VITALS — BP 118/80 | HR 84 | Wt 97.0 lb

## 2016-03-23 DIAGNOSIS — J441 Chronic obstructive pulmonary disease with (acute) exacerbation: Secondary | ICD-10-CM

## 2016-03-23 DIAGNOSIS — J869 Pyothorax without fistula: Secondary | ICD-10-CM

## 2016-03-23 LAB — CULTURE, BLOOD (ROUTINE X 2)
CULTURE: NO GROWTH
Culture: NO GROWTH

## 2016-03-23 MED ORDER — UMECLIDINIUM-VILANTEROL 62.5-25 MCG/INH IN AEPB: 1.0000 | INHALATION_SPRAY | Freq: Every day | RESPIRATORY_TRACT | 11 refills | Status: AC

## 2016-03-23 MED ORDER — LEVOFLOXACIN 500 MG PO TABS: 500.0000 mg | ORAL_TABLET | Freq: Every day | ORAL | 0 refills | Status: AC

## 2016-03-23 NOTE — Patient Instructions (Addendum)
--  CT scan without contrast in 4 weeks, and follow up after that.   --Start increasing your activity level, about 30 minutes of sustained walking at a slow pace every day.   --Stop advair and spiriva, use Anoro inhaler once daily instead.   --Take an additional 5 days of Levaquin 500 mg daily, in addition to what to what has already been prescribed.  --Do not start smoking again.

## 2016-03-23 NOTE — Addendum Note (Signed)
Addended by: Renelda Mom on: 03/23/2016 08:54 AM   Modules accepted: Orders

## 2016-03-23 NOTE — Addendum Note (Signed)
Addended by: Renelda Mom on: 03/23/2016 08:51 AM   Modules accepted: Orders

## 2016-03-25 LAB — COLOGUARD: Cologuard: NEGATIVE

## 2016-04-01 ENCOUNTER — Telehealth: Payer: Self-pay | Admitting: Family Medicine

## 2016-04-01 NOTE — Telephone Encounter (Signed)
Pt was called to inform her of her negative cologuard results. She gave verbal understanding of results.

## 2016-04-01 NOTE — Telephone Encounter (Signed)
error 

## 2016-04-11 ENCOUNTER — Inpatient Hospital Stay: Payer: BC Managed Care – PPO | Admitting: Internal Medicine

## 2016-04-14 ENCOUNTER — Telehealth: Payer: Self-pay | Admitting: Family Medicine

## 2016-04-14 ENCOUNTER — Ambulatory Visit (INDEPENDENT_AMBULATORY_CARE_PROVIDER_SITE_OTHER): Payer: BC Managed Care – PPO | Admitting: Family Medicine

## 2016-04-14 ENCOUNTER — Encounter: Payer: Self-pay | Admitting: Family Medicine

## 2016-04-14 VITALS — BP 118/62 | HR 87 | Temp 97.7°F | Wt 108.1 lb

## 2016-04-14 DIAGNOSIS — I700 Atherosclerosis of aorta: Secondary | ICD-10-CM | POA: Diagnosis not present

## 2016-04-14 DIAGNOSIS — D649 Anemia, unspecified: Secondary | ICD-10-CM

## 2016-04-14 DIAGNOSIS — J439 Emphysema, unspecified: Secondary | ICD-10-CM | POA: Diagnosis not present

## 2016-04-14 DIAGNOSIS — J9611 Chronic respiratory failure with hypoxia: Secondary | ICD-10-CM

## 2016-04-14 LAB — CBC
HCT: 35.2 % — ABNORMAL LOW (ref 36.0–46.0)
Hemoglobin: 11.5 g/dL — ABNORMAL LOW (ref 12.0–15.0)
MCHC: 32.5 g/dL (ref 30.0–36.0)
MCV: 87.4 fl (ref 78.0–100.0)
PLATELETS: 306 10*3/uL (ref 150.0–400.0)
RBC: 4.03 Mil/uL (ref 3.87–5.11)
RDW: 15.5 % (ref 11.5–15.5)
WBC: 9.2 10*3/uL (ref 4.0–10.5)

## 2016-04-14 MED ORDER — ASPIRIN EC 81 MG PO TBEC: 81.0000 mg | DELAYED_RELEASE_TABLET | Freq: Every day | ORAL | Status: AC

## 2016-04-14 NOTE — Progress Notes (Signed)
Pre visit review using our clinic review tool, if applicable. No additional management support is needed unless otherwise documented below in the visit note. 

## 2016-04-14 NOTE — Assessment & Plan Note (Signed)
Repeating CBC today.

## 2016-04-14 NOTE — Assessment & Plan Note (Signed)
Stable. Patient eager to come off O2. Defer to pulm.

## 2016-04-14 NOTE — Assessment & Plan Note (Signed)
Stable at this time. Continue Anoro.

## 2016-04-14 NOTE — Telephone Encounter (Signed)
pts FMLA coordinator was called and a voicemail was left stating to cal our office back. This was to ask if a letter could be written to extend dates of FMLA paperwork. If this is not able to be done can another copy of FMLA be faxed to our office.

## 2016-04-14 NOTE — Telephone Encounter (Signed)
Please fax

## 2016-04-14 NOTE — Telephone Encounter (Signed)
Letter faxed.

## 2016-04-14 NOTE — Assessment & Plan Note (Signed)
Stable and improving. Eager to come off O2. Continue O2 and defer to pulm about when/if to discontinue.

## 2016-04-14 NOTE — Telephone Encounter (Signed)
Ms. Cristina Hale said that we can type a letter on our letter head with the dates of extension for the FMLA. Her fax number is [phone].

## 2016-04-14 NOTE — Patient Instructions (Addendum)
Continue your meds.  I will talk to [facility].  Follow up in 3 months.  Take care  Dr. Lacinda Axon

## 2016-04-14 NOTE — Progress Notes (Signed)
Subjective:  Patient ID: Cristina Hale, female    DOB: [birth year]  Age: 56 y.o. MRN: [id number]  CC: Follow up  HPI:  56 year old female with recent admission for HCAP, COPD, Chronic respiratory failure on 3L [place] O2 presents for follow up.  COPD, Chronic respiratory failure  Stable at this time.  Exercise tolerance improving.  Exercising 30 mins/day (Walking).  Has been taking some time off O2 and maintaining sats  Compliant with Anoro.  Has gained weight since our last visit.  Anemia  Noted on review of recent labs.  Needs repeat CBC today.  Aortic atherosclerosis  Continues to abstain from smoking.  Compliant with aspirin.   Social Hx   Social History   Social History  . Marital status: Married    Spouse name: N/A  . Number of children: N/A  . Years of education: N/A   Social History Main Topics  . Smoking status: Former Smoker    Packs/day: 0.50    Quit date: 02/25/2016  . Smokeless tobacco: Never Used     Comment: Started at age of 2  . Alcohol use No  . Drug use: No  . Sexual activity: Yes    Partners: Male   Other Topics Concern  . None   Social History Narrative  . None    Review of Systems  Constitutional: Positive for fatigue.  Respiratory: Positive for shortness of breath.    Objective:  BP 118/62   Pulse 87   Temp 97.7 F (36.5 C) (Oral)   Wt 108 lb 2 oz (49 kg)   SpO2 91%   BMI 19.46 kg/m   BP/Weight 04/14/2016 03/23/2016 07/02/930  Systolic BP 355 732 94  Diastolic BP 62 80 42  Wt. (Lbs) 108.13 97 -  BMI 19.46 17.46 -    Physical Exam  Constitutional: She is oriented to person, place, and time.  Thin female in NAD. [place] O2 place.  Cardiovascular: Normal rate and regular rhythm.   Pulmonary/Chest: She has no wheezes. She has no rales.  Neurological: She is alert and oriented to person, place, and time.  Psychiatric: She has a normal mood and affect.  Vitals reviewed.   Lab Results  Component Value Date   WBC 9.2  04/14/2016   HGB 11.5 (L) 04/14/2016   HCT 35.2 (L) 04/14/2016   PLT 306.0 04/14/2016   GLUCOSE 83 03/20/2016   CHOL 132 03/17/2016   TRIG 76.0 03/17/2016   HDL 43.10 03/17/2016   LDLCALC 74 03/17/2016   ALT 54 (H) 03/17/2016   AST 26 03/17/2016   NA 133 (L) 03/20/2016   K 3.8 03/20/2016   CL 100 (L) 03/20/2016   CREATININE <0.30 (L) 03/20/2016   BUN 9 03/20/2016   CO2 29 03/20/2016   HGBA1C 5.7 (H) 03/17/2016    Assessment & Plan:   Problem List Items Addressed This Visit    Aortic atherosclerosis ([place])    Has quit smoking.  Lipids well controlled. Continue Aspirin.       Relevant Medications   aspirin EC 81 MG tablet   COPD (chronic obstructive pulmonary disease) (HCC) - Primary    Stable at this time. Continue Anoro.      Chronic respiratory failure with hypoxia (HCC)    Stable. Patient eager to come off O2. Defer to pulm.      Anemia    Repeating CBC today.      Relevant Orders   CBC (Completed)  Meds ordered this encounter  Medications  . aspirin EC 81 MG tablet    Sig: Take 1 tablet (81 mg total) by mouth daily.   Follow-up: Return in about 3 months (around 07/15/2016).  [facility]

## 2016-04-14 NOTE — Assessment & Plan Note (Signed)
Has quit smoking.  Lipids well controlled. Continue Aspirin.

## 2016-04-18 ENCOUNTER — Telehealth: Payer: Self-pay | Admitting: Pulmonary Disease

## 2016-04-18 NOTE — Telephone Encounter (Signed)
Patient wanted to make sure ct on 3/22 would be available for Dr. Alva Cristina Hale to see results on her 3/23 visit.  Please call.

## 2016-04-18 NOTE — Telephone Encounter (Signed)
As long as she has it done at a [facility] facility it can be seen by him. Thanks.

## 2016-04-21 ENCOUNTER — Ambulatory Visit
Admission: RE | Admit: 2016-04-21 | Discharge: 2016-04-21 | Disposition: A | Discharge status: Home / Self Care | Payer: BC Managed Care – PPO | Source: Ambulatory Visit | Attending: Internal Medicine | Admitting: Internal Medicine

## 2016-04-21 DIAGNOSIS — R918 Other nonspecific abnormal finding of lung field: Secondary | ICD-10-CM | POA: Insufficient documentation

## 2016-04-21 DIAGNOSIS — J441 Chronic obstructive pulmonary disease with (acute) exacerbation: Secondary | ICD-10-CM

## 2016-04-21 DIAGNOSIS — I700 Atherosclerosis of aorta: Secondary | ICD-10-CM | POA: Insufficient documentation

## 2016-04-21 DIAGNOSIS — J869 Pyothorax without fistula: Secondary | ICD-10-CM

## 2016-04-21 DIAGNOSIS — J984 Other disorders of lung: Secondary | ICD-10-CM | POA: Insufficient documentation

## 2016-04-22 ENCOUNTER — Ambulatory Visit (INDEPENDENT_AMBULATORY_CARE_PROVIDER_SITE_OTHER): Payer: BC Managed Care – PPO | Admitting: Pulmonary Disease

## 2016-04-22 ENCOUNTER — Encounter: Payer: Self-pay | Admitting: Pulmonary Disease

## 2016-04-22 VITALS — BP 108/68 | HR 87 | Wt 110.0 lb

## 2016-04-22 DIAGNOSIS — R0902 Hypoxemia: Secondary | ICD-10-CM

## 2016-04-22 DIAGNOSIS — J1100 Influenza due to unidentified influenza virus with unspecified type of pneumonia: Secondary | ICD-10-CM

## 2016-04-22 DIAGNOSIS — R918 Other nonspecific abnormal finding of lung field: Secondary | ICD-10-CM | POA: Diagnosis not present

## 2016-04-22 DIAGNOSIS — J432 Centrilobular emphysema: Secondary | ICD-10-CM

## 2016-04-22 NOTE — Progress Notes (Signed)
PULMONARY OFFICE FOLLOW UP NOTE  PT PROFILE: 56 year old female former smoker with previously undiagnosed emphysema admitted to the PCCM service 03/02/16 with acute influenza pneumonia. During that hospitalization she required high flow oxygen but was never in extremis. She was eventually discharged home on 03/23/16 with supplemental oxygen. Follow-up with her primary care physician on 02/16 revealed bilateral ulnar infiltrates and prompted admission for pneumonia. Discharged home on 02/19 with antibiotics.  PROBLEMS:  Former smoker Emphysema - appears severe by CT scanning. Minimal exertional limitation. Acute influenza 03/02/2016 with severe hypoxemia and prolonged hospitalization Presumed bacterial pneumonia 03/18/2016  DATA: CT chest 03/18/16: Severe emphysematous changes. Multifocal infiltrative changes. Some of the opacities have a nodular appearance. CT chest 04/21/16: Significant improvement in the previously visualized areas of airspace consolidation. There remain multiple nodular opacities most of which appear improved compared to previously. Persistent changes of emphysema noted.  INTERVAL HISTORY: Since her most recent discharge, no major events  SUBJ: This is a routine follow-up. She has no new complaints. She is wearing oxygen was sleep and most of the day. She is on Anoro inhaler. She has an albuterol rescue inhaler which she is not using. She feels that she has returned to her previous baseline health. She is not smoking. Denies CP, fever, purulent sputum, hemoptysis, LE edema and calf tenderness  OBJ: Vitals:   04/22/16 1211  BP: 108/68  Pulse: 87  SpO2: 94%  Weight: 110 lb (49.9 kg)  RA  RA SpO2 93% with ambulation   Gen: Thin but not cachectic in NAD HEENT: All WNL Neck: NO LAN, no JVD noted Lungs: Mildly diminished breath sounds throughout, hyperresonance to percussion throughout, no wheezes or other adventitious sounds Cardiovascular: Reg rate, normal rhythm,  no M noted Abdomen: Soft, NT +BS Ext: no C/C/E Neuro: No focal deficits  DATA: BMP Latest Ref Rng & Units 03/20/2016 03/19/2016 03/18/2016  Glucose 65 - 99 mg/dL 83 86 -  BUN 6 - 20 mg/dL 9 13 -  Creatinine 0.44 - 1.00 mg/dL <0.30(L) 0.44 0.30(L)  Sodium 135 - 145 mmol/L 133(L) 132(L) -  Potassium 3.5 - 5.1 mmol/L 3.8 3.7 -  Chloride 101 - 111 mmol/L 100(L) 102 -  CO2 22 - 32 mmol/L 29 28 -  Calcium 8.9 - 10.3 mg/dL 7.6(L) 6.9(L) -   CBC Latest Ref Rng & Units 04/14/2016 03/20/2016 03/19/2016  WBC 4.0 - 10.5 K/uL 9.2 11.5(H) 12.0(H)  Hemoglobin 12.0 - 15.0 g/dL 11.5(L) 9.1(L) 8.8(L)  Hematocrit 36.0 - 46.0 % 35.2(L) 27.4(L) 26.7(L)  Platelets 150.0 - 400.0 K/uL 306.0 390 331   No new chest x-ray  IMPRESSION: 1) former smoker - she is adamant that she will not resume smoking 2) emphysema - appears severe by CT scan but with minimal exertional limitation 3) hypoxemic respiratory failure - appears to be resolving. Her room air oxygen saturation remained above 90% with ambulation during this encounter. 4) recent influenza A 5) recent pneumonia with multifocal infiltrates - presumed to be post influenza bacterial pneumonia, NOS  PLAN: 1) continue Anoro inhaler - she may try on and off this medication to discern whether it is of any benefit with regard to her respiratory symptoms 2) instructed that she may stop oxygen during the day. For now continue with sleep. 3) we have ordered overnight oximetry. 4) we will [facility] her home nebulizer as she is not using this and does not need it 5) follow-up in 4 months with repeat CT chest and PFTs  Merton Border, MD PCCM service Mobile [phone] Pager [phone] 04/22/2016

## 2016-04-22 NOTE — Patient Instructions (Addendum)
1) You may stop the oxygen during the day. Continue with sleep for now 2) We will arrange for overnight oximetry - if OK, we will stop nocturnal oxygen 3) We will have [facility] remove nebulizer 4) You may try off of Anoro to discern whether it is helping your breathing at all. If not, you may discontinue it 5) Follow up in 4 months with repeat CT chest and with lung function tests prior

## 2016-04-25 ENCOUNTER — Ambulatory Visit: Payer: BC Managed Care – PPO | Admitting: Internal Medicine

## 2016-05-09 ENCOUNTER — Telehealth: Payer: Self-pay | Admitting: Family Medicine

## 2016-05-09 ENCOUNTER — Telehealth: Payer: Self-pay | Admitting: Pulmonary Disease

## 2016-05-09 NOTE — Telephone Encounter (Signed)
Pt called, she needs a return to work note. Please advise, [phone].

## 2016-05-09 NOTE — Telephone Encounter (Signed)
Pt would like results of over night oxygen test. Please call.

## 2016-05-09 NOTE — Telephone Encounter (Signed)
Cristina Hale,   Write her a note for me. Make sure she wants a return to full time or part time.

## 2016-05-09 NOTE — Telephone Encounter (Signed)
Spoke with Corene Cornea at [facility] and he states report has not be done yet and once they receive it he will have the RT dept to fax it to me. Called pt and informed her of the response per [facility]. Pt verbalized understanding. Nothing further needed.

## 2016-05-09 NOTE — Telephone Encounter (Signed)
Pt was called and asked when she'd like to return and would she like to be full time or part time. Pt stated Monday 05/16/16 and full time.   Pt asked for letter to be faxed to cheryl [facility] number is [phone]

## 2016-05-11 ENCOUNTER — Ambulatory Visit
Admission: RE | Admit: 2016-05-11 | Discharge: 2016-05-11 | Disposition: A | Discharge status: Home / Self Care | Payer: BC Managed Care – PPO | Source: Ambulatory Visit | Attending: Family Medicine | Admitting: Family Medicine

## 2016-05-11 DIAGNOSIS — Z1239 Encounter for other screening for malignant neoplasm of breast: Secondary | ICD-10-CM

## 2016-05-11 DIAGNOSIS — Z1231 Encounter for screening mammogram for malignant neoplasm of breast: Secondary | ICD-10-CM | POA: Diagnosis present

## 2016-05-12 ENCOUNTER — Other Ambulatory Visit: Payer: Self-pay | Admitting: Family Medicine

## 2016-05-12 ENCOUNTER — Telehealth: Payer: Self-pay | Admitting: Radiology

## 2016-05-12 DIAGNOSIS — D649 Anemia, unspecified: Secondary | ICD-10-CM

## 2016-05-12 NOTE — Telephone Encounter (Signed)
Pt coming in for labs tomorrow, please place future orders. Thank you.  

## 2016-05-13 ENCOUNTER — Telehealth: Payer: Self-pay | Admitting: *Deleted

## 2016-05-13 ENCOUNTER — Other Ambulatory Visit (INDEPENDENT_AMBULATORY_CARE_PROVIDER_SITE_OTHER): Payer: BC Managed Care – PPO

## 2016-05-13 DIAGNOSIS — D649 Anemia, unspecified: Secondary | ICD-10-CM | POA: Diagnosis not present

## 2016-05-13 LAB — CBC
HCT: 38.3 % (ref 36.0–46.0)
HEMOGLOBIN: 12.4 g/dL (ref 12.0–15.0)
MCHC: 32.4 g/dL (ref 30.0–36.0)
MCV: 87.8 fl (ref 78.0–100.0)
Platelets: 245 10*3/uL (ref 150.0–400.0)
RBC: 4.36 Mil/uL (ref 3.87–5.11)
RDW: 15.9 % — AB (ref 11.5–15.5)
WBC: 6.8 10*3/uL (ref 4.0–10.5)

## 2016-05-13 LAB — IRON,TIBC AND FERRITIN PANEL
%SAT: 19 % (ref 11–50)
Ferritin: 64 ng/mL (ref 10–232)
Iron: 61 ug/dL (ref 45–160)
TIBC: 325 ug/dL (ref 250–450)

## 2016-05-13 LAB — VITAMIN B12: Vitamin B-12: 197 pg/mL — ABNORMAL LOW (ref 211–911)

## 2016-05-13 LAB — FOLATE: Folate: 7.2 ng/mL (ref >5.9)

## 2016-05-13 NOTE — Telephone Encounter (Signed)
Pt informed she needs to continue her O2 @ 2L QHS. Nothing further needed.

## 2016-06-09 ENCOUNTER — Telehealth: Payer: Self-pay | Admitting: *Deleted

## 2016-06-09 NOTE — Telephone Encounter (Signed)
If persists, will need to be seen. I can find not obvious reason upon review of chart.

## 2016-06-09 NOTE — Telephone Encounter (Signed)
Please advise, An Appt?

## 2016-06-09 NOTE — Telephone Encounter (Signed)
Patient reported losing full strands of her hair, with no other symptoms.  Pt requested a call to discuss care  Pt contact [phone]

## 2016-06-10 NOTE — Telephone Encounter (Signed)
Patient has been notified. patient was transferred up front to schedule appointment

## 2016-06-10 NOTE — Telephone Encounter (Signed)
Can you please call her, thanks

## 2016-06-13 ENCOUNTER — Ambulatory Visit: Payer: BC Managed Care – PPO | Admitting: Pulmonary Disease

## 2016-06-14 ENCOUNTER — Ambulatory Visit (INDEPENDENT_AMBULATORY_CARE_PROVIDER_SITE_OTHER): Payer: BC Managed Care – PPO | Admitting: Family Medicine

## 2016-06-14 ENCOUNTER — Encounter: Payer: Self-pay | Admitting: Family Medicine

## 2016-06-14 VITALS — BP 126/74 | HR 68 | Temp 97.7°F | Wt 120.4 lb

## 2016-06-14 DIAGNOSIS — M79671 Pain in right foot: Secondary | ICD-10-CM

## 2016-06-14 DIAGNOSIS — L659 Nonscarring hair loss, unspecified: Secondary | ICD-10-CM | POA: Diagnosis not present

## 2016-06-14 LAB — COMPREHENSIVE METABOLIC PANEL
ALBUMIN: 4.1 g/dL (ref 3.5–5.2)
ALK PHOS: 74 U/L (ref 39–117)
ALT: 13 U/L (ref 0–35)
AST: 20 U/L (ref 0–37)
BUN: 13 mg/dL (ref 6–23)
CHLORIDE: 102 meq/L (ref 96–112)
CO2: 31 mEq/L (ref 19–32)
CREATININE: 0.62 mg/dL (ref 0.40–1.20)
Calcium: 9.9 mg/dL (ref 8.4–10.5)
GFR: 105 mL/min (ref >60.00)
Glucose, Bld: 80 mg/dL (ref 70–99)
POTASSIUM: 4.6 meq/L (ref 3.5–5.1)
SODIUM: 139 meq/L (ref 135–145)
TOTAL PROTEIN: 7.5 g/dL (ref 6.0–8.3)
Total Bilirubin: 0.3 mg/dL (ref 0.2–1.2)

## 2016-06-14 LAB — IRON,TIBC AND FERRITIN PANEL
%SAT: 14 % (ref 11–50)
FERRITIN: 41 ng/mL (ref 10–232)
Iron: 45 ug/dL (ref 45–160)
TIBC: 312 ug/dL (ref 250–450)

## 2016-06-14 LAB — CBC
HEMATOCRIT: 37.2 % (ref 36.0–46.0)
Hemoglobin: 12.3 g/dL (ref 12.0–15.0)
MCHC: 33 g/dL (ref 30.0–36.0)
MCV: 86.5 fl (ref 78.0–100.0)
Platelets: 234 10*3/uL (ref 150.0–400.0)
RBC: 4.3 Mil/uL (ref 3.87–5.11)
RDW: 15.2 % (ref 11.5–15.5)
WBC: 8 10*3/uL (ref 4.0–10.5)

## 2016-06-14 LAB — VITAMIN B12: Vitamin B-12: 597 pg/mL (ref 211–911)

## 2016-06-14 LAB — TSH: TSH: 2.82 u[IU]/mL (ref 0.35–4.50)

## 2016-06-14 NOTE — Progress Notes (Signed)
Subjective:  Patient ID: Cristina Hale, female    DOB: 08-25-1960  Age: 56 y.o. MRN: [id number]  CC: Hair loss, foot pain  HPI:  56 year old female with COPD, chronic respiratory failure (now on nightly O2 only) presents with the above complaints.   Hair loss  3 week history of diffuse hair loss.  No focality.  She states that she notes a significant amount of hair loss with washing and brushing.  She is a very concerned about this.  She has changed a hair product as recommended by her beautician.  No new stressors as of late.  No medications or other intervention stride.  Foot pain  Patient reports that she's had pain of the dorsum of her right foot the past several weeks.  No known inciting event. No recent fall, trauma, injury.  She is walking regularly.  She states that she has been wearing more open toed shoes as well as [place].  Slightly swollen. Tender to palpation.  No other associated symptoms. No other complaints at this time.   Social Hx   Social History   Social History  . Marital status: Married    Spouse name: N/A  . Number of children: N/A  . Years of education: N/A   Social History Main Topics  . Smoking status: Former Smoker    Packs/day: 0.50    Quit date: 02/25/2016  . Smokeless tobacco: Never Used     Comment: Started at age of 1  . Alcohol use No  . Drug use: No  . Sexual activity: Yes    Partners: Male   Other Topics Concern  . None   Social History Narrative  . None    Review of Systems  Constitutional: Negative.   Musculoskeletal:       R foot pain.  Skin:       Hair loss.    Objective:  BP 126/74   Pulse 68   Temp 97.7 F (36.5 C) (Oral)   Wt 120 lb 6 oz (54.6 kg)   SpO2 92%   BMI 21.67 kg/m   BP/Weight 06/14/2016 04/22/2016 08/03/5007  Systolic BP 381 829 937  Diastolic BP 74 68 62  Wt. (Lbs) 120.38 110 108.13  BMI 21.67 19.8 19.46    Physical Exam  Constitutional: She is oriented to person, place,  and time. She appears well-developed. No distress.  Cardiovascular: Normal rate and regular rhythm.   Pulmonary/Chest: Effort normal. She has no wheezes. She has no rales.  Musculoskeletal:  Right foot - tenderness over the lateral dorsum of the foot. Slight swelling. No erythema. Normal range of motion in the ankle.  Neurological: She is alert and oriented to person, place, and time.  Skin:  No focal hair loss. Hair appears to be thinning. No abnormalities noted regarding the scalp.  Psychiatric: She has a normal mood and affect.  Vitals reviewed.   Lab Results  Component Value Date   WBC 6.8 05/13/2016   HGB 12.4 05/13/2016   HCT 38.3 05/13/2016   PLT 245.0 05/13/2016   GLUCOSE 83 03/20/2016   CHOL 132 03/17/2016   TRIG 76.0 03/17/2016   HDL 43.10 03/17/2016   LDLCALC 74 03/17/2016   ALT 54 (H) 03/17/2016   AST 26 03/17/2016   NA 133 (L) 03/20/2016   K 3.8 03/20/2016   CL 100 (L) 03/20/2016   CREATININE <0.30 (L) 03/20/2016   BUN 9 03/20/2016   CO2 29 03/20/2016   HGBA1C 5.7 (H) 03/17/2016  Assessment & Plan:   Problem List Items Addressed This Visit    Hair loss - Primary    New problem. Uncertain etiology at this time. Obtaining laboratory studies today. Offered derm referral and she declined at this time.      Relevant Orders   CBC   Comprehensive metabolic panel   TSH   Iron, TIBC and Ferritin Panel   B12   Foot pain, right    New problem. Uncertain etiology. No fall/trauma/injury. Advised better footwear and over-the-counter Tylenol as needed. If persists, will arrange for her to see sports medicine for ultrasound.        Follow-up: PRN  [place]

## 2016-06-14 NOTE — Patient Instructions (Signed)
Labs today.  Let me know if the foot pain continues.  Take care  Dr. Lacinda Axon

## 2016-06-14 NOTE — Assessment & Plan Note (Signed)
New problem. Uncertain etiology. No fall/trauma/injury. Advised better footwear and over-the-counter Tylenol as needed. If persists, will arrange for her to see sports medicine for ultrasound.

## 2016-06-14 NOTE — Assessment & Plan Note (Signed)
New problem. Uncertain etiology at this time. Obtaining laboratory studies today. Offered derm referral and she declined at this time.

## 2016-07-25 ENCOUNTER — Other Ambulatory Visit (HOSPITAL_COMMUNITY)
Admission: RE | Admit: 2016-07-25 | Discharge: 2016-07-25 | Disposition: A | Discharge status: Home / Self Care | Payer: BC Managed Care – PPO | Source: Ambulatory Visit | Attending: Family Medicine | Admitting: Family Medicine

## 2016-07-25 ENCOUNTER — Encounter: Payer: Self-pay | Admitting: Family Medicine

## 2016-07-25 ENCOUNTER — Ambulatory Visit (INDEPENDENT_AMBULATORY_CARE_PROVIDER_SITE_OTHER): Payer: BC Managed Care – PPO | Admitting: Family Medicine

## 2016-07-25 VITALS — BP 118/74 | HR 71 | Temp 98.0°F | Wt 127.5 lb

## 2016-07-25 DIAGNOSIS — Z124 Encounter for screening for malignant neoplasm of cervix: Secondary | ICD-10-CM | POA: Diagnosis not present

## 2016-07-25 DIAGNOSIS — J439 Emphysema, unspecified: Secondary | ICD-10-CM | POA: Diagnosis not present

## 2016-07-25 DIAGNOSIS — J9611 Chronic respiratory failure with hypoxia: Secondary | ICD-10-CM | POA: Diagnosis not present

## 2016-07-25 NOTE — Patient Instructions (Addendum)
Continue your meds.  Follow up in 6 months.  We will call with the pap results.  Take care  Dr. Lacinda Axon

## 2016-07-25 NOTE — Assessment & Plan Note (Signed)
Stable. Continue Anoro and follow up with Pulm.

## 2016-07-25 NOTE — Assessment & Plan Note (Signed)
Pap performed today. 

## 2016-07-25 NOTE — Assessment & Plan Note (Signed)
Stable. Continue O2 at night.

## 2016-07-25 NOTE — Progress Notes (Signed)
Subjective:  Patient ID: Cristina Hale, female    DOB: September 01, 1960  Age: 56 y.o. MRN: [id number]  CC: Follow up  HPI:  56 year old female with aortic atherosclerosis, chronic respiratory failure, COPD presents for follow-up.  COPD  Patient is doing well at this time.  She endorses compliance with Anoro.  No oxygen during the day. She does have oxygen at night (2L).  Has upcoming appointment pulmonology and will be getting repeat CT and pulmonary function tests.  Shortness of breath is at baseline.  Chronic respiratory failure  Stable. Only requiring oxygen at night.  Cervical cancer screening  Has a history of cervical cancer.  Needs Pap smear and is amenable to this today.  Social Hx   Social History   Social History  . Marital status: Married    Spouse name: N/A  . Number of children: N/A  . Years of education: N/A   Social History Main Topics  . Smoking status: Former Smoker    Packs/day: 0.50    Quit date: 02/25/2016  . Smokeless tobacco: Never Used     Comment: Started at age of 75  . Alcohol use No  . Drug use: No  . Sexual activity: Yes    Partners: Male   Other Topics Concern  . None   Social History Narrative  . None    Review of Systems  Constitutional: Negative.   Respiratory: Positive for shortness of breath.    Objective:  BP 118/74 (BP Location: Left Arm, Patient Position: Sitting, Cuff Size: Normal)   Pulse 71   Temp 98 F (36.7 C) (Oral)   Wt 127 lb 8 oz (57.8 kg)   SpO2 98%   BMI 22.95 kg/m   BP/Weight 07/25/2016 06/14/2016 6/76/7209  Systolic BP 470 962 836  Diastolic BP 74 74 68  Wt. (Lbs) 127.5 120.38 110  BMI 22.95 21.67 19.8   Physical Exam  Constitutional: She is oriented to person, place, and time. She appears well-developed. No distress.  Cardiovascular: Normal rate and regular rhythm.   Pulmonary/Chest: Effort normal and breath sounds normal. She has no wheezes. She has no rales.  Genitourinary:  Genitourinary  Comments: Pelvic Exam: External: normal female genitalia without lesions or masses Vagina: normal without lesions or masses Cervix: normal without lesions or masses. Pap smear: performed   Neurological: She is alert and oriented to person, place, and time.  Psychiatric: She has a normal mood and affect.  Vitals reviewed.   Lab Results  Component Value Date   WBC 8.0 06/14/2016   HGB 12.3 06/14/2016   HCT 37.2 06/14/2016   PLT 234.0 06/14/2016   GLUCOSE 80 06/14/2016   CHOL 132 03/17/2016   TRIG 76.0 03/17/2016   HDL 43.10 03/17/2016   LDLCALC 74 03/17/2016   ALT 13 06/14/2016   AST 20 06/14/2016   NA 139 06/14/2016   K 4.6 06/14/2016   CL 102 06/14/2016   CREATININE 0.62 06/14/2016   BUN 13 06/14/2016   CO2 31 06/14/2016   TSH 2.82 06/14/2016   HGBA1C 5.7 (H) 03/17/2016    Assessment & Plan:   Problem List Items Addressed This Visit      Respiratory   Chronic respiratory failure with hypoxia (HCC)    Stable. Continue O2 at night.       COPD (chronic obstructive pulmonary disease) (HCC) - Primary    Stable. Continue Anoro and follow up with Pulm.        Other   Screening for  cervical cancer    Pap performed today.       Relevant Orders   Cytology - PAP     Follow-up: 6 months  [place] DO [facility]

## 2016-07-26 LAB — CYTOLOGY - PAP
Diagnosis: NEGATIVE
HPV: NOT DETECTED

## 2016-08-02 ENCOUNTER — Ambulatory Visit
Admission: RE | Admit: 2016-08-02 | Discharge: 2016-08-02 | Disposition: A | Discharge status: Home / Self Care | Payer: BC Managed Care – PPO | Source: Ambulatory Visit | Attending: Pulmonary Disease | Admitting: Pulmonary Disease

## 2016-08-02 ENCOUNTER — Ambulatory Visit (HOSPITAL_COMMUNITY): Payer: BC Managed Care – PPO

## 2016-08-02 DIAGNOSIS — I700 Atherosclerosis of aorta: Secondary | ICD-10-CM | POA: Diagnosis not present

## 2016-08-02 DIAGNOSIS — R918 Other nonspecific abnormal finding of lung field: Secondary | ICD-10-CM

## 2016-08-02 DIAGNOSIS — J432 Centrilobular emphysema: Secondary | ICD-10-CM

## 2016-08-02 MED ORDER — ALBUTEROL SULFATE (2.5 MG/3ML) 0.083% IN NEBU
2.5000 mg | INHALATION_SOLUTION | Freq: Once | RESPIRATORY_TRACT | Status: AC
  Administered 2016-08-02: 2.5 mg via RESPIRATORY_TRACT
  Filled 2016-08-02: qty 3

## 2016-08-08 ENCOUNTER — Ambulatory Visit (INDEPENDENT_AMBULATORY_CARE_PROVIDER_SITE_OTHER): Payer: BC Managed Care – PPO | Admitting: Pulmonary Disease

## 2016-08-08 VITALS — BP 106/68 | HR 105 | Ht 62.5 in | Wt 130.0 lb

## 2016-08-08 DIAGNOSIS — J432 Centrilobular emphysema: Secondary | ICD-10-CM

## 2016-08-08 DIAGNOSIS — G4734 Idiopathic sleep related nonobstructive alveolar hypoventilation: Secondary | ICD-10-CM

## 2016-08-08 DIAGNOSIS — J449 Chronic obstructive pulmonary disease, unspecified: Secondary | ICD-10-CM

## 2016-08-08 NOTE — Patient Instructions (Signed)
You may try on and off Anoro inhaler to see if it is providing any benefit with regards to shortness of breath Continue oxygen with sleep Continue your excellent efforts at keeping herself active and your weight proper Follow-up in 6 months at which time we will repeat chest x-ray and pulmonary function tests

## 2016-08-09 NOTE — Progress Notes (Signed)
PULMONARY OFFICE FOLLOW UP NOTE  PT PROFILE: 56 year old female former smoker with previously undiagnosed emphysema admitted to the PCCM service 03/02/16 with acute influenza pneumonia. During that hospitalization she required high flow oxygen but was never in extremis. She was eventually discharged home on 03/23/16 with supplemental oxygen. Follow-up with her primary care physician on 03/18/16 revealed bilateral pulmonary infiltrates and prompted admission for pneumonia. Discharged home on 03/21/16.  PROBLEMS:  Former smoker Severe emphysema. Acute influenza 03/02/2016 with severe hypoxemia and prolonged hospitalization Presumed bacterial pneumonia 03/18/2016  DATA: CT chest 03/18/16: Severe emphysematous changes. Multifocal infiltrative changes. Some of the opacities have a nodular appearance. CT chest 04/21/16: Significant improvement in the previously visualized areas of airspace consolidation. There remain multiple nodular opacities most of which appear improved compared to previously. Persistent changes of emphysema noted. PFTs 08/02/16: FVC: 2.24 L (78 %pred), FEV1: 0.73 L (31 %pred), FEV1/FVC: 32%, TLC: 5.10 L (111 %pred), DLCO 35 %pred    INTERVAL HISTORY: No major events  SUBJ: This is a routine follow-up. She has no new complaints. Here to review results of PFTs and response to Anoro. She can discern little or no improvement with Anoro. Continues to wear oxygen was sleep and exertion. Rarely uses albuterol rescue inhaler. She is not smoking. Denies CP, fever, purulent sputum, hemoptysis, LE edema and calf tenderness  OBJ: Vitals:   08/08/16 0926 08/08/16 0927  BP:  106/68  Pulse:  (!) 105  SpO2:  91%  Weight: 130 lb (59 kg)   Height: 5' 2.5" (1.588 m)   RA  Gen: Thin but not cachectic in NAD HEENT: All WNL Neck: NO LAN, no JVD noted Lungs: diminished breath sounds throughout, hyperresonance to percussion, no wheezes Cardiovascular: Reg, no M noted Abdomen: Soft, NT  +BS Ext: no C/C/E Neuro: No focal deficits  DATA: BMP Latest Ref Rng & Units 06/14/2016 03/20/2016 03/19/2016  Glucose 70 - 99 mg/dL 80 83 86  BUN 6 - 23 mg/dL 13 9 13   Creatinine 0.40 - 1.20 mg/dL 0.62 <0.30(L) 0.44  Sodium 135 - 145 mEq/L 139 133(L) 132(L)  Potassium 3.5 - 5.1 mEq/L 4.6 3.8 3.7  Chloride 96 - 112 mEq/L 102 100(L) 102  CO2 19 - 32 mEq/L 31 29 28   Calcium 8.4 - 10.5 mg/dL 9.9 7.6(L) 6.9(L)   CBC Latest Ref Rng & Units 06/14/2016 05/13/2016 04/14/2016  WBC 4.0 - 10.5 K/uL 8.0 6.8 9.2  Hemoglobin 12.0 - 15.0 g/dL 12.3 12.4 11.5(L)  Hematocrit 36.0 - 46.0 % 37.2 38.3 35.2(L)  Platelets 150.0 - 400.0 K/uL 234.0 245.0 306.0   No new chest x-ray  IMPRESSION: 1) former smoker 2) Severe COPD/emphysema - extremely well compensated 3) Nocturnal hypoxemia    PLAN: 1) She may try on and off Anoro inhaler to decide whether it is of benefit 2) Continue O2 with sleep 3) I applauded her efforts at remaining active and maintaining proper body weight 4) We discussed at length the severity of her COPD and the expected course including the possibility that we might be considering lung transplantation or other options in the future 5) F/U in 6 months with repeat CXR and PFTs or sooner as needed   Merton Border, MD PCCM service Mobile [phone] Pager [phone] 08/09/2016

## 2016-12-02 ENCOUNTER — Emergency Department
Admission: EM | Admit: 2016-12-02 | Discharge: 2016-12-02 | Disposition: A | Discharge status: Other Acute Inpatient Hospital | Payer: BC Managed Care – PPO | Attending: Emergency Medicine | Admitting: Emergency Medicine

## 2016-12-02 ENCOUNTER — Emergency Department: Payer: BC Managed Care – PPO

## 2016-12-02 DIAGNOSIS — J449 Chronic obstructive pulmonary disease, unspecified: Secondary | ICD-10-CM | POA: Insufficient documentation

## 2016-12-02 DIAGNOSIS — Z7982 Long term (current) use of aspirin: Secondary | ICD-10-CM | POA: Diagnosis not present

## 2016-12-02 DIAGNOSIS — Z79899 Other long term (current) drug therapy: Secondary | ICD-10-CM | POA: Insufficient documentation

## 2016-12-02 DIAGNOSIS — I629 Nontraumatic intracranial hemorrhage, unspecified: Secondary | ICD-10-CM | POA: Diagnosis not present

## 2016-12-02 DIAGNOSIS — Z8541 Personal history of malignant neoplasm of cervix uteri: Secondary | ICD-10-CM | POA: Insufficient documentation

## 2016-12-02 DIAGNOSIS — Z87891 Personal history of nicotine dependence: Secondary | ICD-10-CM | POA: Diagnosis not present

## 2016-12-02 DIAGNOSIS — J9690 Respiratory failure, unspecified, unspecified whether with hypoxia or hypercapnia: Secondary | ICD-10-CM | POA: Diagnosis not present

## 2016-12-02 DIAGNOSIS — R4182 Altered mental status, unspecified: Secondary | ICD-10-CM | POA: Diagnosis present

## 2016-12-02 LAB — DIFFERENTIAL
BASOS ABS: 0.2 10*3/uL — AB (ref 0–0.1)
Basophils Relative: 1 %
Eosinophils Absolute: 0.6 10*3/uL (ref 0–0.7)
Eosinophils Relative: 4 %
LYMPHS ABS: 4.2 10*3/uL — AB (ref 1.0–3.6)
LYMPHS PCT: 29 %
MONOS PCT: 7 %
Monocytes Absolute: 1 10*3/uL — ABNORMAL HIGH (ref 0.2–0.9)
NEUTROS ABS: 8.5 10*3/uL — AB (ref 1.4–6.5)
NEUTROS PCT: 59 %

## 2016-12-02 LAB — CBC
HEMATOCRIT: 37.9 % (ref 35.0–47.0)
HEMOGLOBIN: 12.8 g/dL (ref 12.0–16.0)
MCH: 29 pg (ref 26.0–34.0)
MCHC: 33.8 g/dL (ref 32.0–36.0)
MCV: 85.8 fL (ref 80.0–100.0)
Platelets: 224 10*3/uL (ref 150–440)
RBC: 4.41 MIL/uL (ref 3.80–5.20)
RDW: 14.8 % — ABNORMAL HIGH (ref 11.5–14.5)
WBC: 14.4 10*3/uL — ABNORMAL HIGH (ref 3.6–11.0)

## 2016-12-02 LAB — COMPREHENSIVE METABOLIC PANEL
ALBUMIN: 4.1 g/dL (ref 3.5–5.0)
ALK PHOS: 82 U/L (ref 38–126)
ALT: 18 U/L (ref 14–54)
AST: 30 U/L (ref 15–41)
Anion gap: 9 (ref 5–15)
BILIRUBIN TOTAL: 0.7 mg/dL (ref 0.3–1.2)
BUN: 17 mg/dL (ref 6–20)
CO2: 25 mmol/L (ref 22–32)
CREATININE: 0.59 mg/dL (ref 0.44–1.00)
Calcium: 9.1 mg/dL (ref 8.9–10.3)
Chloride: 101 mmol/L (ref 101–111)
GFR calc Af Amer: >60 mL/min (ref >60)
GLUCOSE: 188 mg/dL — AB (ref 65–99)
POTASSIUM: 3.6 mmol/L (ref 3.5–5.1)
Sodium: 135 mmol/L (ref 135–145)
TOTAL PROTEIN: 7.5 g/dL (ref 6.5–8.1)

## 2016-12-02 LAB — TROPONIN I

## 2016-12-02 LAB — PROTIME-INR
INR: 1.1
Prothrombin Time: 14.1 seconds (ref 11.4–15.2)

## 2016-12-02 LAB — GLUCOSE, CAPILLARY: Glucose-Capillary: 178 mg/dL — ABNORMAL HIGH (ref 65–99)

## 2016-12-02 LAB — APTT: APTT: 25 s (ref 24–36)

## 2016-12-02 MED ORDER — SODIUM CHLORIDE 0.9 % IV SOLN
24.0000 ug | Freq: Once | INTRAVENOUS | Status: DC
  Filled 2016-12-02: qty 6

## 2016-12-02 MED ORDER — MANNITOL 25 % IV SOLN
50.0000 g | Freq: Once | INTRAVENOUS | Status: AC
  Administered 2016-12-02: 12.5 g via INTRAVENOUS
  Filled 2016-12-02 (×2): qty 200

## 2016-12-02 MED ORDER — SODIUM CHLORIDE 4 MEQ/ML IV SOLN
INTRAVENOUS | Status: DC
  Administered 2016-12-02: 21:00:00 via INTRAVENOUS
  Filled 2016-12-02: qty 960

## 2016-12-02 MED ORDER — SUCCINYLCHOLINE CHLORIDE 20 MG/ML IJ SOLN
120.0000 mg | Freq: Once | INTRAMUSCULAR | Status: AC
  Administered 2016-12-02: 120 mg via INTRAVENOUS

## 2016-12-02 MED ORDER — ETOMIDATE 2 MG/ML IV SOLN
10.0000 mg | Freq: Once | INTRAVENOUS | Status: AC
  Administered 2016-12-02: 10 mg via INTRAVENOUS

## 2016-12-02 MED ORDER — SODIUM CHLORIDE 0.9 % IV BOLUS (SEPSIS)
1000.0000 mL | Freq: Once | INTRAVENOUS | Status: AC
  Administered 2016-12-02: 1000 mL via INTRAVENOUS

## 2016-12-02 MED ORDER — SODIUM CHLORIDE 0.9 % IV SOLN: 10.0000 mL/h | Freq: Once | INTRAVENOUS | Status: DC

## 2016-12-02 MED ORDER — SODIUM CHLORIDE 0.9 % IV SOLN
1500.0000 mg | Freq: Once | INTRAVENOUS | Status: AC
  Administered 2016-12-02: 1500 mg via INTRAVENOUS
  Filled 2016-12-02: qty 15

## 2016-12-02 MED ORDER — NICARDIPINE HCL IN NACL 20-0.86 MG/200ML-% IV SOLN
INTRAVENOUS | Status: AC
  Filled 2016-12-02: qty 200

## 2016-12-02 MED ORDER — NOREPINEPHRINE BITARTRATE 1 MG/ML IV SOLN
0.0000 ug/min | Freq: Once | INTRAVENOUS | Status: DC
  Filled 2016-12-02: qty 4

## 2016-12-02 MED ORDER — NICARDIPINE HCL IN NACL 20-0.86 MG/200ML-% IV SOLN
0.0000 mg/h | INTRAVENOUS | Status: DC
Start: 2016-12-02 — End: 2016-12-03

## 2016-12-02 MED ORDER — NALOXONE HCL 2 MG/2ML IJ SOSY
PREFILLED_SYRINGE | INTRAMUSCULAR | Status: AC
  Administered 2016-12-02: 1 mg via INTRAVENOUS
  Filled 2016-12-02: qty 2

## 2016-12-02 MED ORDER — NALOXONE HCL 2 MG/2ML IJ SOSY
1.0000 mg | PREFILLED_SYRINGE | Freq: Once | INTRAMUSCULAR | Status: AC
  Administered 2016-12-02: 1 mg via INTRAVENOUS

## 2016-12-02 NOTE — ED Notes (Signed)
Patient transported to CT 

## 2016-12-02 NOTE — ED Triage Notes (Signed)
Pt brought in by [facility] from home for altered mental status, family found patient altered, unknown last known normal, per EMS about 8 minutes PTA pt became bradycardic. Upon arrival to ED pt responsive to pain, skin is cool and clammy, pt is bradycardic in the 40's. Pt responsive to pain at this time

## 2016-12-02 NOTE — Progress Notes (Signed)
Pt intubated with 7.5@23  by MD without complications. Bs bilateral and equal. Airway secure. Pt placed on vent with alarms set appropriately. Pt currently resting on vent support without any signs of respiratory distress. HR 43 Sao2 100%.

## 2016-12-02 NOTE — ED Notes (Signed)
Pharmacy called about keppra medication, states they will send it to this ED.

## 2016-12-02 NOTE — ED Notes (Signed)
Resp notified of ABG order

## 2016-12-02 NOTE — ED Notes (Signed)
Pt taken off this ED's monitor and placed on transport teams monitors

## 2016-12-02 NOTE — ED Notes (Signed)
ED Provider at bedside. 

## 2016-12-02 NOTE — ED Notes (Signed)
Cristina Hale, Agricultural consultant at [employer] pt is en route with ground transport team

## 2016-12-02 NOTE — ED Notes (Signed)
Family at pts bedside.

## 2016-12-02 NOTE — ED Provider Notes (Signed)
[facility] Emergency Department Provider Note ____________________________________________   I have reviewed the triage vital signs and the triage nursing note.  HISTORY  Chief Complaint Altered Mental Status and Bradycardia   Historian Level 5 Caveat History Limited by altered mental status History obtained by EMS per family report Daughter and husband arrived and give additional history  HPI Cristina Hale is a 56 y.o. female brought in by EMS with report of being found unresponsive on the couch after last being talked to on the phone normal around 430 this afternoon.  No history of ingestion or drug overdose.  Patient was at home alone at that time where the altered mental status occurred.  Daughter states she found her holding her oxygen on the couch.  She has COPD.  No known prior history or family history of aneurysmal hemorrhage.  Symptoms are severe.   Past Medical History:  Diagnosis Date  . Cervical cancer ([place])   . COPD (chronic obstructive pulmonary disease) ([place])   . Pneumonia     Patient Active Problem List   Diagnosis Date Noted  . Screening for cervical cancer 07/25/2016  . Chronic respiratory failure with hypoxia ([place]) 04/14/2016  . Aortic atherosclerosis ([place]) 03/17/2016  . Former smoker 03/17/2016  . COPD (chronic obstructive pulmonary disease) ([place]) 03/17/2016  . History of cervical cancer 03/17/2016    Past Surgical History:  Procedure Laterality Date  . NO PAST SURGERIES      Prior to Admission medications   Medication Sig Start Date End Date Taking? Authorizing Provider  aspirin EC 81 MG tablet Take 1 tablet (81 mg total) by mouth daily. 04/14/16  Yes Cook, Jayce G, DO  umeclidinium-vilanterol (ANORO ELLIPTA) 62.5-25 MCG/INH AEPB Inhale 1 puff into the lungs daily. 03/23/16  Yes Laverle Hobby, MD  ipratropium-albuterol (DUONEB) 0.5-2.5 (3) MG/3ML SOLN Take 3 mLs by nebulization every 6 (six) hours as needed  (shortness of breath). Patient not taking: Reported on 12/02/2016 03/21/16   Vaughan Basta, MD    No Known Allergies  Family History  Problem Relation Age of Onset  . Alcohol abuse Mother   . Heart disease Mother   . Alcohol abuse Father   . Heart disease Maternal Grandmother     Social History Social History  Substance Use Topics  . Smoking status: Former Smoker    Packs/day: 0.50    Quit date: 02/25/2016  . Smokeless tobacco: Never Used     Comment: Started at age of 9  . Alcohol use No    Review of Systems  Level 5 Caveat History Limited by altered mental status  Patient unable to cooperate, but family states had been in reasonable normal health without recent illnesses or fevers or complaints of pains or headaches.  ____________________________________________   PHYSICAL EXAM:  VITAL SIGNS: ED Triage Vitals [12/02/16 1843]  Enc Vitals Group     BP 93/77     Pulse Rate (!) 43     Resp 20     Temp (!) 97.4 F (36.3 C)     Temp Source Axillary     SpO2 99 %     Weight      Height      Head Circumference      Peak Flow      Pain Score      Pain Loc      Pain Edu?      Excl. in [place]?      Constitutional: Opens eyes intermittently not exactly  to her voice.   HEENT   Head: Normocephalic and atraumatic.      Eyes: Conjunctivae are normal. Pupils equal and round, slightly small around 3 mm.  No gaze deviation.      Ears:         Nose: No congestion/rhinnorhea.   Mouth/Throat: Mucous membranes are moist.   Neck: No stridor. Cardiovascular/Chest: Bradycardic, irregular.  No murmurs, rubs, or gallops. Respiratory: Shallow respirations, no wheezing or rhonchi. Gastrointestinal: Soft. No distention.   Genitourinary/rectal:Deferred Musculoskeletal: Moving 4 extremities, appears to be withdrawing or localizing to pain.  Not following any commands. Neurologic: No facial droop.  Not following commands.  Not making any verbal.  Moving 4  extremities but not to command.  Able to cooperate with additional conference of neurologic testing. Skin:  Skin is warm, dry and intact. No rash noted.    ____________________________________________  LABS (pertinent positives/negatives) I, Lisa Roca, MD the attending physician have reviewed the labs noted below.  Labs Reviewed  CBC - Abnormal; Notable for the following:       Result Value   WBC 14.4 (*)    RDW 14.8 (*)    All other components within normal limits  DIFFERENTIAL - Abnormal; Notable for the following:    Neutro Abs 8.5 (*)    Lymphs Abs 4.2 (*)    Monocytes Absolute 1.0 (*)    Basophils Absolute 0.2 (*)    All other components within normal limits  COMPREHENSIVE METABOLIC PANEL - Abnormal; Notable for the following:    Glucose, Bld 188 (*)    All other components within normal limits  GLUCOSE, CAPILLARY - Abnormal; Notable for the following:    Glucose-Capillary 178 (*)    All other components within normal limits  PROTIME-INR  APTT  TROPONIN I  BLOOD GAS, ARTERIAL  CBG MONITORING, ED  PREPARE PLATELET PHERESIS  TYPE AND SCREEN    ____________________________________________    EKG I, Lisa Roca, MD, the attending physician have personally viewed and interpreted all ECGs.  43 bpm.  Sinus bradycardia.  Incomplete right bundle branch block.  Normal axis.  Normal ST and T wave ____________________________________________  RADIOLOGY All Xrays were viewed by me.  Imaging interpreted by Radiologist, and I, Lisa Roca, MD the attending physician have reviewed the radiologist interpretation noted below.  CT head and cervical spine:  IMPRESSION: CT BRAIN:  1. 5.6 x 3.7 x 4.9 cm (51 mL) intraparenchymal hematoma centered at the left sylvian fissure with associated moderate to large volume subarachnoid hemorrhage as above. 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 Further evaluation with CTA recommended. 2. Associated  mass effect with up to 10 mm of left-to-right midline shift. Associated crowding of the basilar cisterns with possible early descent of the cerebellar tonsils through the foramen magnum. No hydrocephalus or ventricular trapping at this time. CT CERVICAL SPINE:  1. Motion degraded exam. No definite acute traumatic injury within the cervical spine. 2. Moderate degenerative spondylolysis at C5-6. Critical Value/emergent results were called by telephone at the time of interpretation on 12/02/2016 at 7:05 pm to Dr. Lisa Roca , who verbally acknowledged these results.  CXR:  IMPRESSION: 1. Satisfactory positioning of the NG tube and endotracheal tube. 2. Aortic atherosclerosis. 3. No acute cardiopulmonary disease. __________________________________________  PROCEDURES  Procedure(s) performed: INTUBATION Performed by: Lisa Roca  Required items: required blood products, implants, devices, and special equipment available Patient identity confirmed: provided demographic data and hospital-assigned identification number Time out: Immediately prior to procedure a "  time out" was called to verify the correct patient, procedure, equipment, support staff and site/side marked as required.  Indications: Not protecting airway  Intubation method: Glidescope Laryngoscopy   Preoxygenation: BVM  Sedatives: Etomidate Paralytic: Succinylcholine  Tube Size: 7.5 cuffed  Post-procedure assessment: chest rise and ETCO2 monitor Breath sounds: equal and absent over the epigastrium Tube secured with: ETT holder Chest x-ray interpreted by radiologist and me.  Chest x-ray findings: endotracheal tube in appropriate position  Patient tolerated the procedure well with no immediate complications.     Critical Care performed: CRITICAL CARE Performed by: Lisa Roca   Total critical care time: 100 minutes  Critical care time was exclusive of separately billable procedures and treating other  patients.  Critical care was necessary to treat or prevent imminent or life-threatening deterioration.  Critical care was time spent personally by me on the following activities: development of treatment plan with patient and/or surrogate as well as nursing, discussions with consultants, evaluation of patient's response to treatment, examination of patient, obtaining history from patient or surrogate, ordering and performing treatments and interventions, ordering and review of laboratory studies, ordering and review of radiographic studies, pulse oximetry and re-evaluation of patient's condition.   ____________________________________________  No current facility-administered medications on file prior to encounter.    Current Outpatient Prescriptions on File Prior to Encounter  Medication Sig Dispense Refill  . aspirin EC 81 MG tablet Take 1 tablet (81 mg total) by mouth daily.    Marland Kitchen umeclidinium-vilanterol (ANORO ELLIPTA) 62.5-25 MCG/INH AEPB Inhale 1 puff into the lungs daily. 60 each 11  . ipratropium-albuterol (DUONEB) 0.5-2.5 (3) MG/3ML SOLN Take 3 mLs by nebulization every 6 (six) hours as needed (shortness of breath). (Patient not taking: Reported on 12/02/2016) 360 mL 0    ____________________________________________  ED COURSE / ASSESSMENT AND PLAN  Pertinent labs & imaging results that were available during my care of the patient were reviewed by me and considered in my medical decision making (see chart for details).    Patient arrived with altered mental status, heart rate in the upper 30s-60s and borderline respirations on her own.  Family arrived almost as soon as the patient did, and indicated no history or concern about overdose.  Patient sent immediately to CT scan which showed large head bleed with shift.  I shared this discouraging news with the family.  Preparations were made for intubation as patient's respirations were borderline.  Intubation was successfully  obtained with glide scope.  I discussed with [facility] for transfer and recommendations for additional medical stabilization and treatment for the head bleed.  Head of bed was also maintained elevated.  Family was kept up-to-date.  I called blood bank to find out having on the platelets as [facility] here for transport around 837, platelets are not going to be ready( had not been thawed/sent from [facility] yet), and so they were canceled.  DIFFERENTIAL DIAGNOSIS: Including but not limited to drug overdose, hypercarbic restaurant failure, sepsis, electrolyte disturbance, postictal, hemorrhagic or ischemic CVA, etc.  CONSULTATIONS:   Dr. Steele Berg, [facility] neuro intensivist, accepts patient, initially awaiting ICU versus ED designation.  Received phone call back that patient will be ED to ED transfer.  [facility] helicopter grounded due to weather.  Patient will go by [employer] ground transport.  Discussion with Dr. Steele Berg, initially recommended Keppra and pressor to keep map above 65 and blood pressure systolic below 270.  Additionally recommended mannitol 1 g/kg, 1.8 sodium 25 cc/h and DDAVP 0.4 mcg/kg to  aid with anti-coagulation reversal associated with aspirin.  Patient / Family / Caregiver informed of clinical course, medical decision-making process, and agree with plan.  ___________________________________________   FINAL CLINICAL IMPRESSION(S) / ED DIAGNOSES   Final diagnoses:  Intracranial hemorrhage ([place])  Respiratory failure, unspecified chronicity, unspecified whether with hypoxia or hypercapnia ([place])              Note: This dictation was prepared with Dragon dictation. Any transcriptional errors that result from this process are unintentional    Lisa Roca, MD 12/02/16 2037

## 2016-12-02 NOTE — ED Notes (Signed)
Consent signed (by pt's husband due to pt being intubated) for blood products and placed on chart at secretaries desk

## 2016-12-02 NOTE — ED Notes (Signed)
Family at bedside. 

## 2016-12-02 NOTE — ED Notes (Addendum)
Pharmacy notified by EDP about DDAVP inf

## 2016-12-02 NOTE — ED Notes (Addendum)
Pt received one vial of mannitol, [facility] ground team here and will adm their mannitol to pt per transport team

## 2016-12-02 NOTE — ED Notes (Signed)
Pharmacy notified of mannitol order

## 2016-12-02 NOTE — ED Notes (Signed)
DDAVP received by pharmacy at this time and given to [facility] ground team

## 2016-12-02 NOTE — ED Notes (Addendum)
No in line filter IV tubing found by charge RN for mannitol, CCU called states they will send some to ED.

## 2016-12-06 ENCOUNTER — Telehealth: Payer: Self-pay | Admitting: Family Medicine

## 2016-12-06 NOTE — Telephone Encounter (Signed)
Former patient of mine and I got notice of her passing. Encounter to look at her recent hospital course and cause of death.  [place]

## 2016-12-31 DEATH — deceased

## 2017-01-26 ENCOUNTER — Ambulatory Visit: Payer: BC Managed Care – PPO | Admitting: Family Medicine

## 2017-01-27 ENCOUNTER — Ambulatory Visit: Payer: BC Managed Care – PPO | Admitting: Family Medicine

## 2018-12-23 IMAGING — DX DG CHEST 2V
2 series · 2 of 2 positions shown · non-contrast
Comparison: 03/05/2016

CLINICAL DATA: Elevated white blood cell count. Shortness of breath

EXAM:
CHEST  2 VIEW

[chest pa]
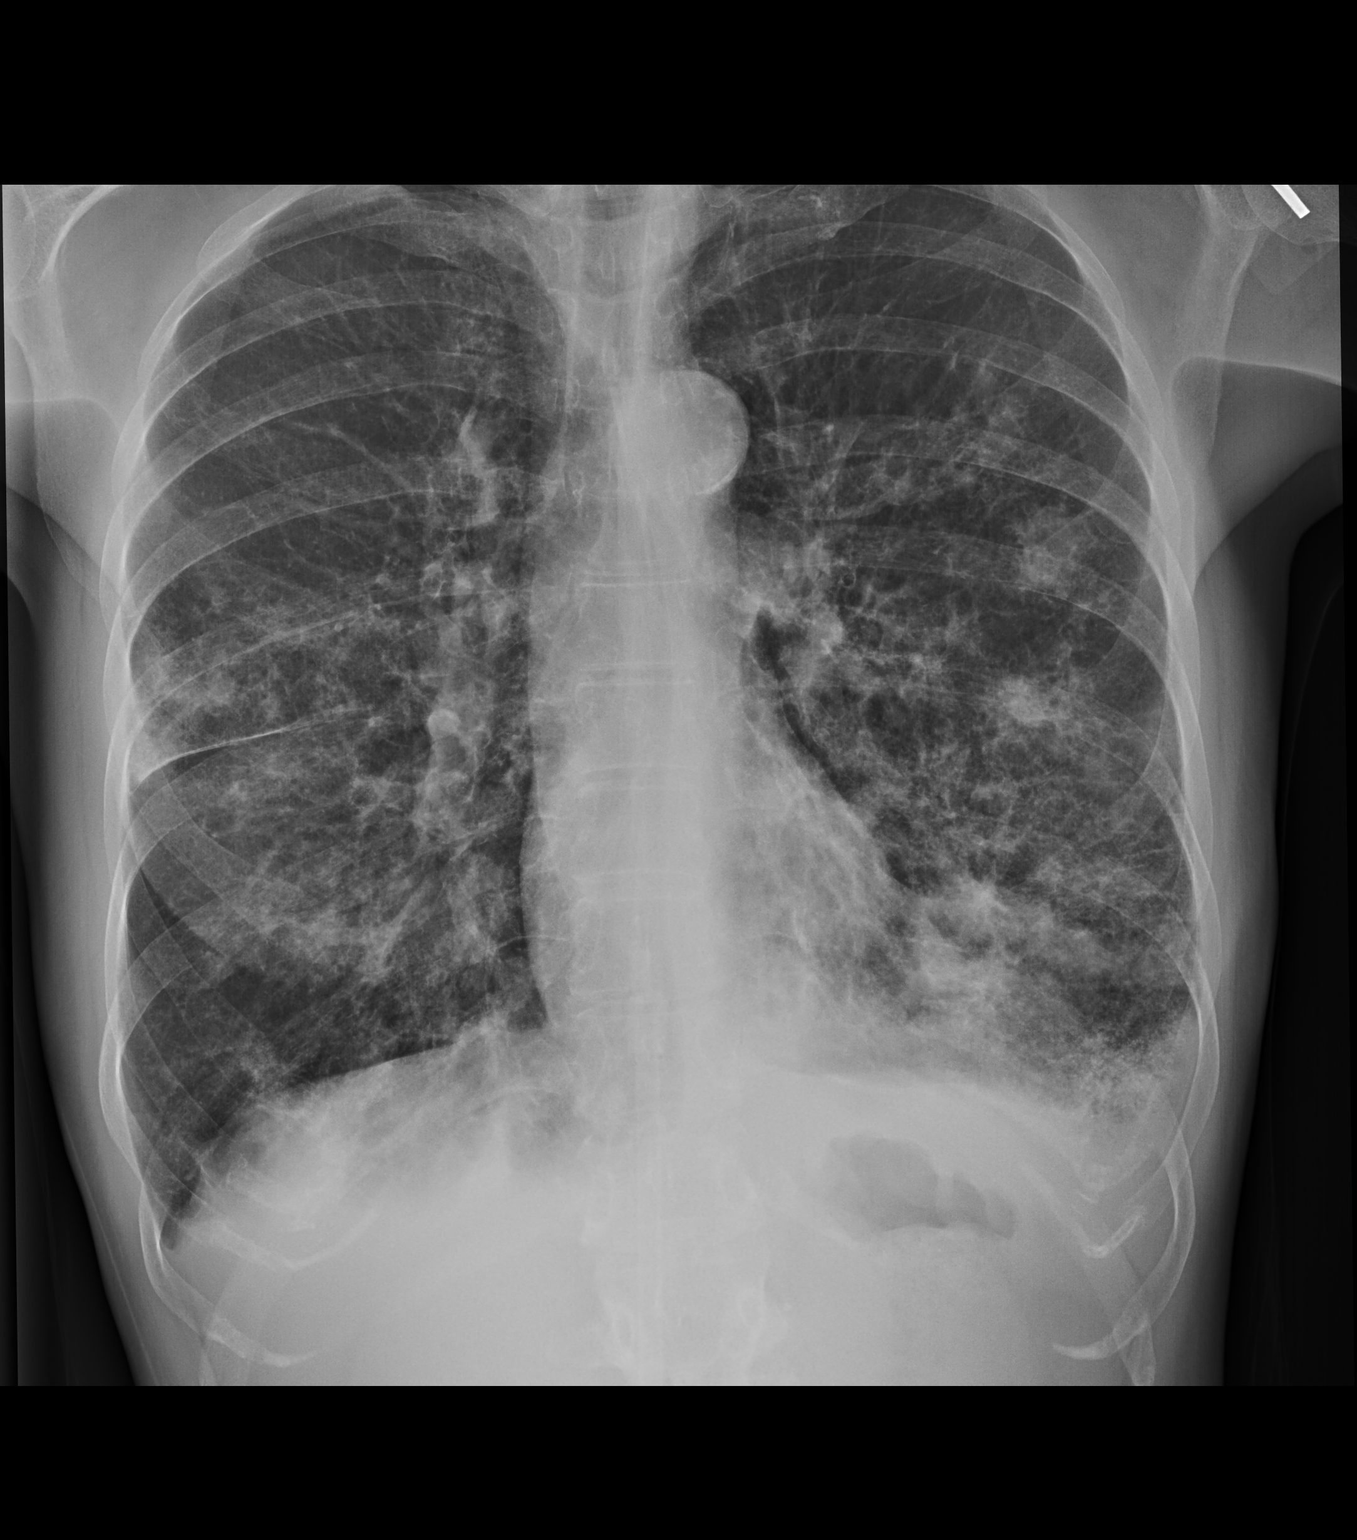

[chest lat]
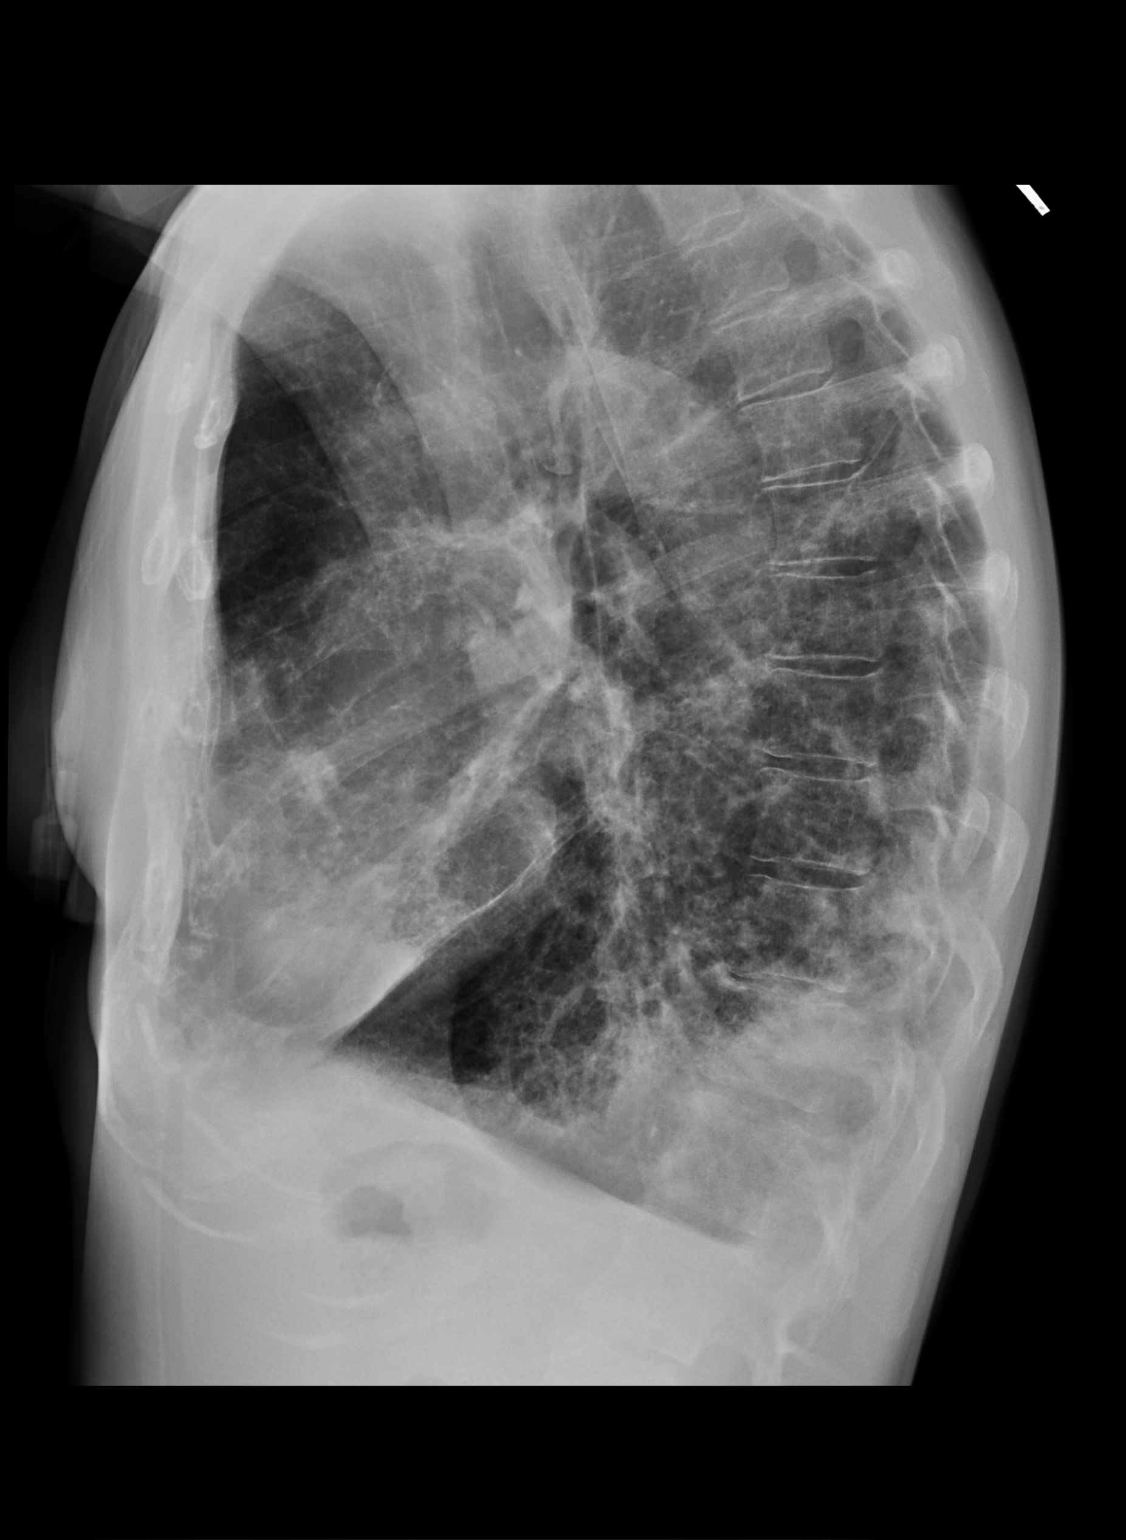

[2 of 2 positions shown; findings below may reference images not displayed]

FINDINGS: Patchy bilateral airspace disease compatible with multifocal
pneumonia. Underlying COPD. Heart is normal size. Probable small
left effusion. No acute bony abnormality.
IMPRESSION: COPD.

Patchy bilateral airspace opacities compatible with multifocal
pneumonia.

Small left effusion.

## 2018-12-23 IMAGING — CT CT CHEST W/O CM
2 of 3 series · 15 of 36 positions shown, 18 images · non-contrast
Comparison: Chest radiographs dated 03/18/2016

CLINICAL DATA: Cough, shortness of breath, flu, pneumonia

EXAM:
CT CHEST WITHOUT CONTRAST
TECHNIQUE: Multidetector CT imaging of the chest was performed following the
standard protocol without IV contrast.

[Series 2: thorax · axial · 0.63mm/px · z∈[-283,-41]mm · 12 of 143 slices shown, 15 images]
[im 11/143  mediastinal]
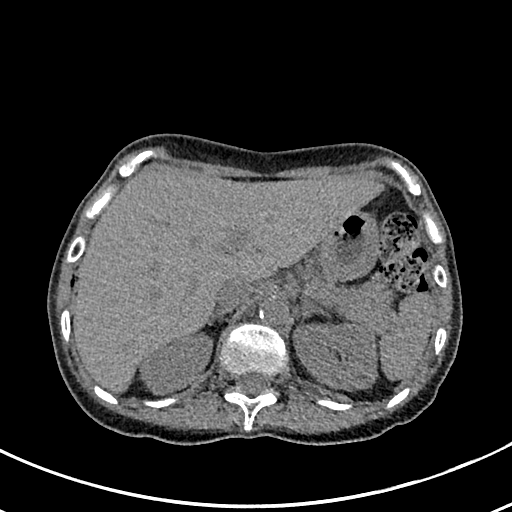
[im 11/143  lung]
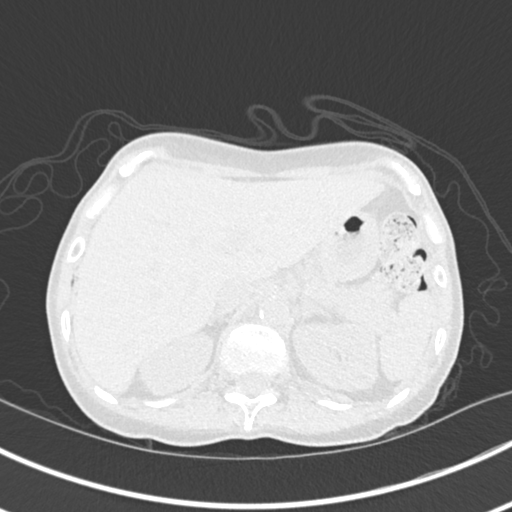
[im 22/143  lung]
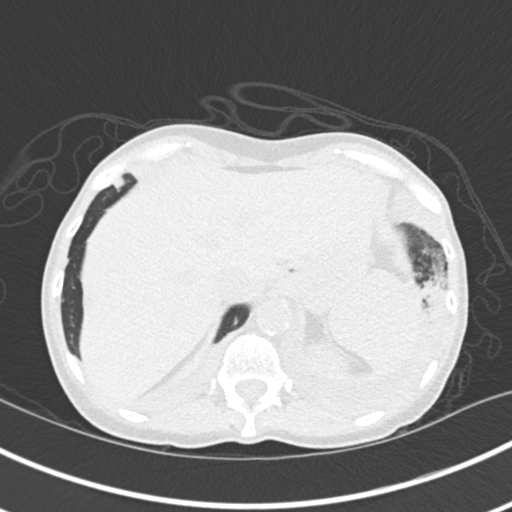
[im 32/143  lung]
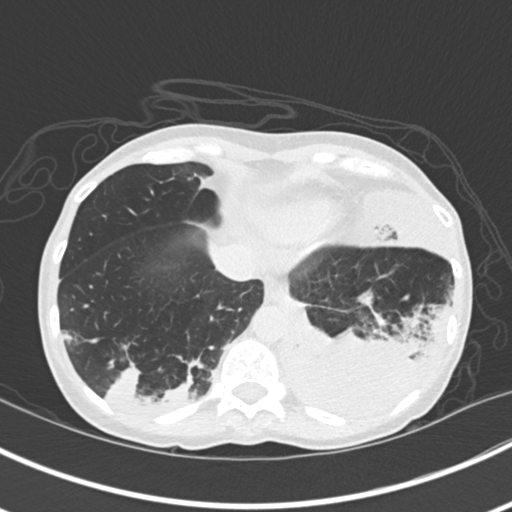
[im 43/143  lung]
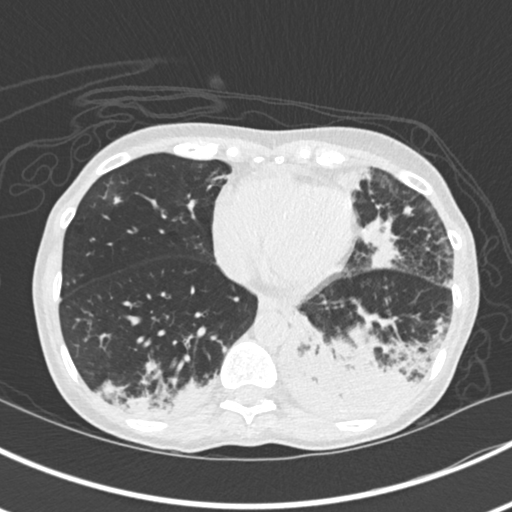
[im 53/143  mediastinal]
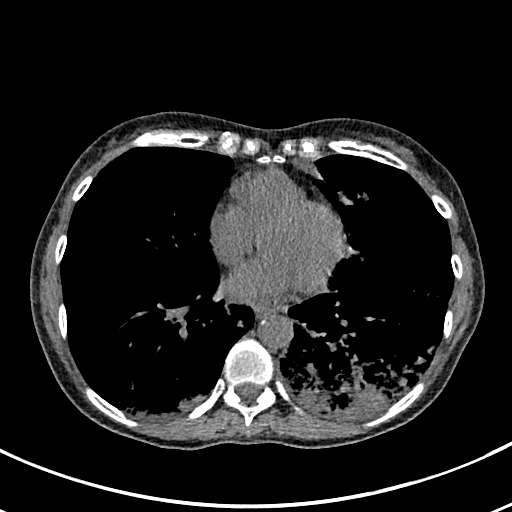
[im 53/143  lung]
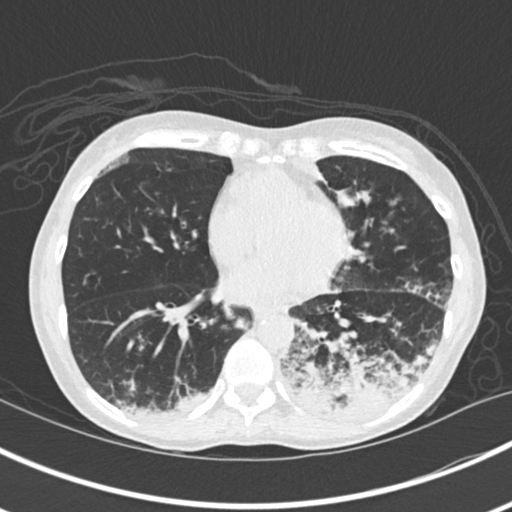
[im 64/143  lung]
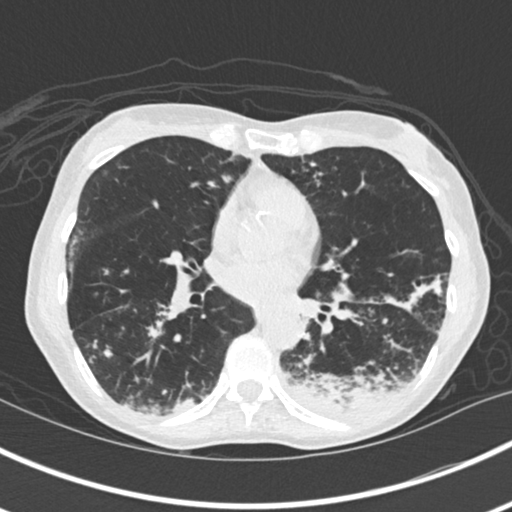
[im 79/143  lung]
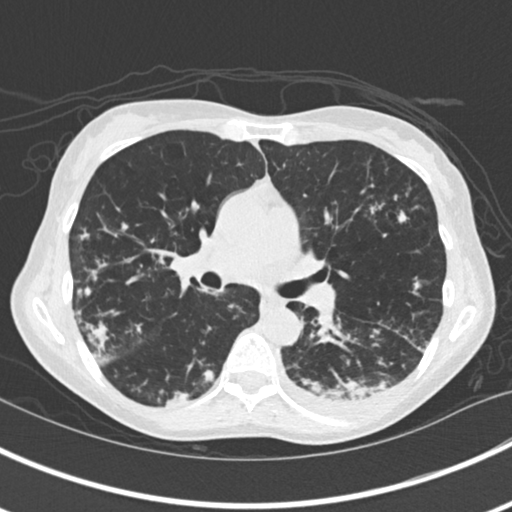
[im 90/143  lung]
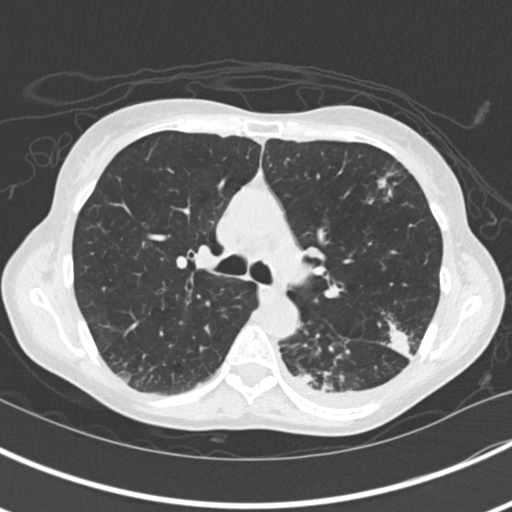
[im 100/143  mediastinal]
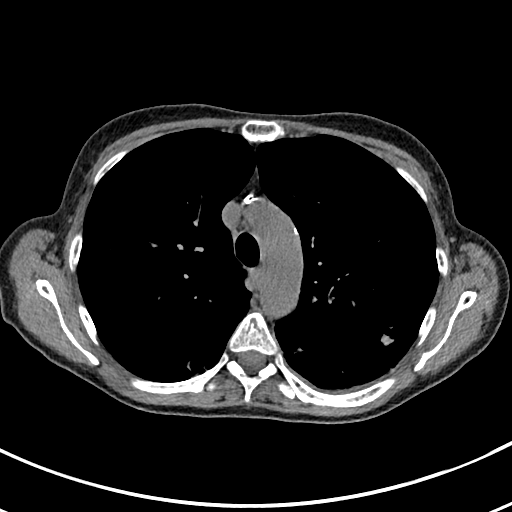
[im 100/143  lung]
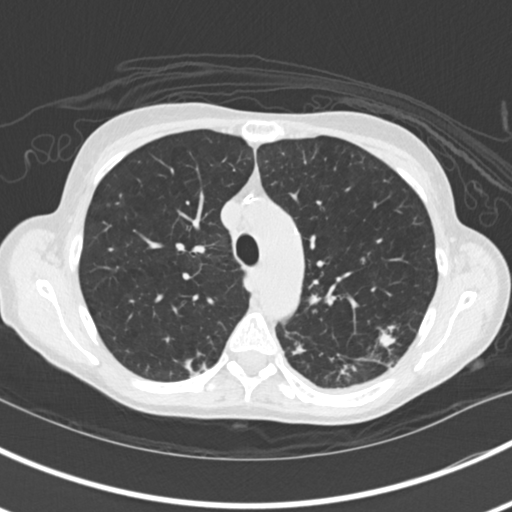
[im 111/143  lung]
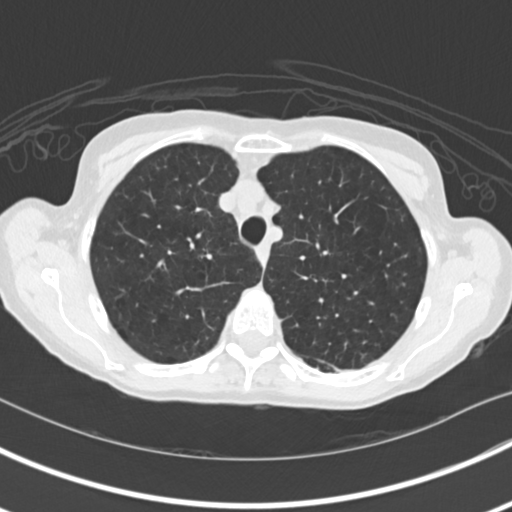
[im 121/143  lung]
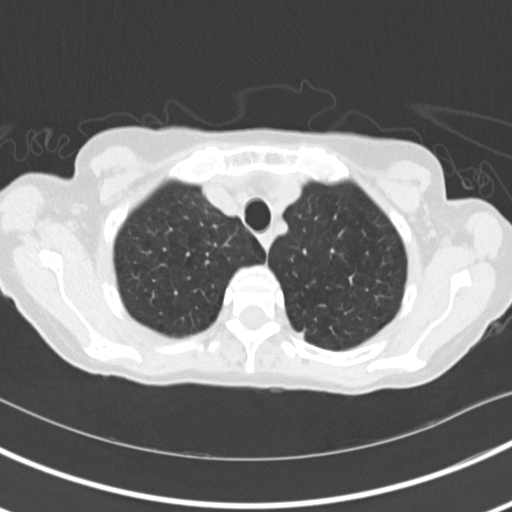
[im 132/143  lung]
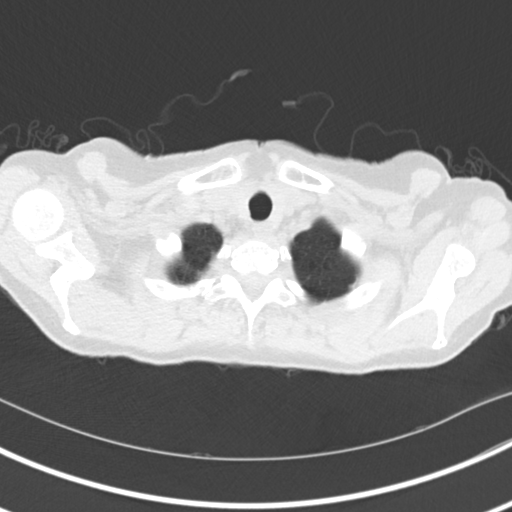

[Series 5: coronal · coronal · 0.60mm/px · 3 of 107 slices shown]
[im 22/107  lung]
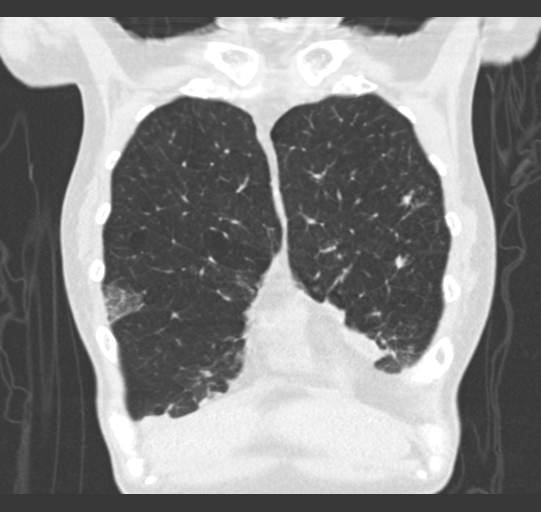
[im 43/107  lung]
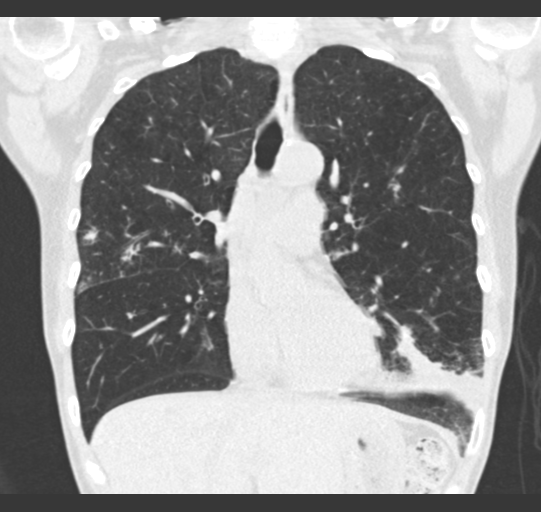
[im 64/107  lung]
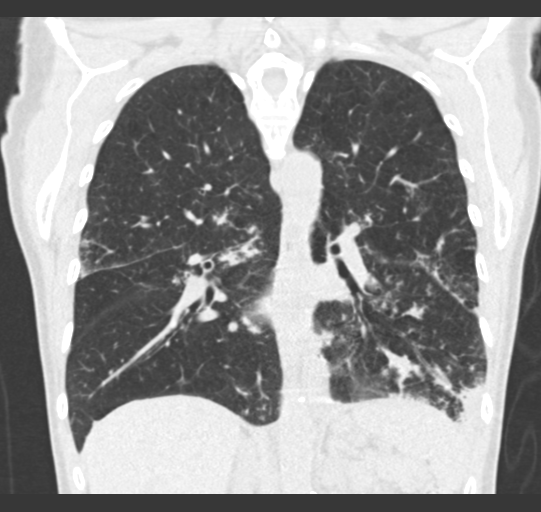

[15 of 36 positions shown; findings below may reference images not displayed]

FINDINGS: Cardiovascular: Heart is normal in size. Trace anterior pericardial
fluid.

Mild atherosclerotic calcifications of the aortic arch.

Mediastinum/Nodes: Small mediastinal lymph nodes which do not meet
pathologic CT size criteria.

Visualized thyroid is unremarkable.

Lungs/Pleura: Multifocal patchy/nodular opacities in the bilateral
upper and lower lobes, left lower lobe predominant, suspicious for
multifocal pneumonia. Mild posterior distribution.

Underlying mild centrilobular emphysematous changes.

Small bilateral pleural effusions, left greater than right.

No pneumothorax.

Upper Abdomen: Visualized upper abdomen is grossly unremarkable,
noting 8 10 mm cyst in the central liver and mild vascular
calcifications.

Musculoskeletal: Mild degenerative changes of the visualized
thoracolumbar spine.
IMPRESSION: Multifocal pneumonia in the bilateral upper and lower lobes, left
lower lobe predominant.

Small bilateral pleural effusions, left greater than right.
